# Patient Record
Sex: Female | Born: 1950
Health system: Southern US, Community
[De-identification: ages and names within clinical notes are randomized; demographics above are authoritative.]

## PROBLEM LIST (undated history)

## (undated) DIAGNOSIS — E559 Vitamin D deficiency, unspecified: Secondary | ICD-10-CM

## (undated) DIAGNOSIS — E039 Hypothyroidism, unspecified: Secondary | ICD-10-CM

## (undated) DIAGNOSIS — F419 Anxiety disorder, unspecified: Secondary | ICD-10-CM

## (undated) HISTORY — DX: Anxiety disorder, unspecified: F41.9

## (undated) HISTORY — DX: Hypothyroidism, unspecified: E03.9

## (undated) HISTORY — DX: Vitamin D deficiency, unspecified: E55.9

---

## 1998-09-19 ENCOUNTER — Other Ambulatory Visit: Admission: RE | Admit: 1998-09-19 | Discharge: 1998-09-19 | Payer: Self-pay | Admitting: *Deleted

## 2000-03-09 ENCOUNTER — Other Ambulatory Visit: Admission: RE | Admit: 2000-03-09 | Discharge: 2000-03-09 | Payer: Self-pay | Admitting: *Deleted

## 2001-03-12 ENCOUNTER — Other Ambulatory Visit: Admission: RE | Admit: 2001-03-12 | Discharge: 2001-03-12 | Payer: Self-pay | Admitting: Obstetrics and Gynecology

## 2002-06-07 ENCOUNTER — Other Ambulatory Visit: Admission: RE | Admit: 2002-06-07 | Discharge: 2002-06-07 | Payer: Self-pay | Admitting: Obstetrics and Gynecology

## 2015-02-15 DIAGNOSIS — Z1389 Encounter for screening for other disorder: Secondary | ICD-10-CM | POA: Diagnosis not present

## 2015-02-15 DIAGNOSIS — E039 Hypothyroidism, unspecified: Secondary | ICD-10-CM | POA: Diagnosis not present

## 2015-02-15 DIAGNOSIS — Z13220 Encounter for screening for lipoid disorders: Secondary | ICD-10-CM | POA: Diagnosis not present

## 2015-02-15 DIAGNOSIS — M8588 Other specified disorders of bone density and structure, other site: Secondary | ICD-10-CM | POA: Diagnosis not present

## 2015-02-15 DIAGNOSIS — Z1211 Encounter for screening for malignant neoplasm of colon: Secondary | ICD-10-CM | POA: Diagnosis not present

## 2015-02-15 DIAGNOSIS — Z0000 Encounter for general adult medical examination without abnormal findings: Secondary | ICD-10-CM | POA: Diagnosis not present

## 2015-02-15 DIAGNOSIS — E559 Vitamin D deficiency, unspecified: Secondary | ICD-10-CM | POA: Diagnosis not present

## 2015-02-15 DIAGNOSIS — R7309 Other abnormal glucose: Secondary | ICD-10-CM | POA: Diagnosis not present

## 2015-02-16 DIAGNOSIS — R7309 Other abnormal glucose: Secondary | ICD-10-CM | POA: Diagnosis not present

## 2015-02-16 DIAGNOSIS — E559 Vitamin D deficiency, unspecified: Secondary | ICD-10-CM | POA: Diagnosis not present

## 2015-02-16 DIAGNOSIS — Z1389 Encounter for screening for other disorder: Secondary | ICD-10-CM | POA: Diagnosis not present

## 2015-02-16 DIAGNOSIS — E039 Hypothyroidism, unspecified: Secondary | ICD-10-CM | POA: Diagnosis not present

## 2015-02-16 DIAGNOSIS — Z13220 Encounter for screening for lipoid disorders: Secondary | ICD-10-CM | POA: Diagnosis not present

## 2015-02-16 DIAGNOSIS — Z0000 Encounter for general adult medical examination without abnormal findings: Secondary | ICD-10-CM | POA: Diagnosis not present

## 2015-02-19 ENCOUNTER — Other Ambulatory Visit: Payer: Self-pay | Admitting: Family Medicine

## 2015-02-19 DIAGNOSIS — M8580 Other specified disorders of bone density and structure, unspecified site: Secondary | ICD-10-CM

## 2015-02-23 DIAGNOSIS — E039 Hypothyroidism, unspecified: Secondary | ICD-10-CM | POA: Diagnosis not present

## 2015-02-23 DIAGNOSIS — E559 Vitamin D deficiency, unspecified: Secondary | ICD-10-CM | POA: Diagnosis not present

## 2015-03-09 DIAGNOSIS — N951 Menopausal and female climacteric states: Secondary | ICD-10-CM | POA: Diagnosis not present

## 2015-03-22 DIAGNOSIS — M8580 Other specified disorders of bone density and structure, unspecified site: Secondary | ICD-10-CM | POA: Diagnosis not present

## 2015-03-22 DIAGNOSIS — Z1231 Encounter for screening mammogram for malignant neoplasm of breast: Secondary | ICD-10-CM | POA: Diagnosis not present

## 2015-04-18 DIAGNOSIS — K635 Polyp of colon: Secondary | ICD-10-CM | POA: Diagnosis not present

## 2015-04-18 DIAGNOSIS — Z1211 Encounter for screening for malignant neoplasm of colon: Secondary | ICD-10-CM | POA: Diagnosis not present

## 2015-04-18 DIAGNOSIS — K640 First degree hemorrhoids: Secondary | ICD-10-CM | POA: Diagnosis not present

## 2015-04-18 DIAGNOSIS — D125 Benign neoplasm of sigmoid colon: Secondary | ICD-10-CM | POA: Diagnosis not present

## 2015-05-02 DIAGNOSIS — H5213 Myopia, bilateral: Secondary | ICD-10-CM | POA: Diagnosis not present

## 2015-05-02 DIAGNOSIS — H5210 Myopia, unspecified eye: Secondary | ICD-10-CM | POA: Diagnosis not present

## 2015-05-08 DIAGNOSIS — E559 Vitamin D deficiency, unspecified: Secondary | ICD-10-CM | POA: Diagnosis not present

## 2015-06-19 DIAGNOSIS — Z0100 Encounter for examination of eyes and vision without abnormal findings: Secondary | ICD-10-CM | POA: Diagnosis not present

## 2015-06-29 DIAGNOSIS — E612 Magnesium deficiency: Secondary | ICD-10-CM | POA: Diagnosis not present

## 2015-06-29 DIAGNOSIS — E7219 Other disorders of sulfur-bearing amino-acid metabolism: Secondary | ICD-10-CM | POA: Diagnosis not present

## 2015-06-29 DIAGNOSIS — E559 Vitamin D deficiency, unspecified: Secondary | ICD-10-CM | POA: Diagnosis not present

## 2015-06-29 DIAGNOSIS — N951 Menopausal and female climacteric states: Secondary | ICD-10-CM | POA: Diagnosis not present

## 2015-06-29 DIAGNOSIS — R739 Hyperglycemia, unspecified: Secondary | ICD-10-CM | POA: Diagnosis not present

## 2015-06-29 DIAGNOSIS — E279 Disorder of adrenal gland, unspecified: Secondary | ICD-10-CM | POA: Diagnosis not present

## 2015-08-23 DIAGNOSIS — M79641 Pain in right hand: Secondary | ICD-10-CM | POA: Diagnosis not present

## 2015-08-27 DIAGNOSIS — M720 Palmar fascial fibromatosis [Dupuytren]: Secondary | ICD-10-CM | POA: Diagnosis not present

## 2015-11-23 DIAGNOSIS — M720 Palmar fascial fibromatosis [Dupuytren]: Secondary | ICD-10-CM | POA: Diagnosis not present

## 2016-02-29 DIAGNOSIS — S0501XA Injury of conjunctiva and corneal abrasion without foreign body, right eye, initial encounter: Secondary | ICD-10-CM | POA: Diagnosis not present

## 2016-04-15 DIAGNOSIS — E612 Magnesium deficiency: Secondary | ICD-10-CM | POA: Diagnosis not present

## 2016-04-15 DIAGNOSIS — E279 Disorder of adrenal gland, unspecified: Secondary | ICD-10-CM | POA: Diagnosis not present

## 2016-04-15 DIAGNOSIS — E7219 Other disorders of sulfur-bearing amino-acid metabolism: Secondary | ICD-10-CM | POA: Diagnosis not present

## 2016-04-15 DIAGNOSIS — E039 Hypothyroidism, unspecified: Secondary | ICD-10-CM | POA: Diagnosis not present

## 2016-04-15 DIAGNOSIS — E8840 Mitochondrial metabolism disorder, unspecified: Secondary | ICD-10-CM | POA: Diagnosis not present

## 2016-04-15 DIAGNOSIS — E7210 Disorders of sulfur-bearing amino-acid metabolism, unspecified: Secondary | ICD-10-CM | POA: Diagnosis not present

## 2016-04-15 DIAGNOSIS — E782 Mixed hyperlipidemia: Secondary | ICD-10-CM | POA: Diagnosis not present

## 2016-04-15 DIAGNOSIS — E611 Iron deficiency: Secondary | ICD-10-CM | POA: Diagnosis not present

## 2016-04-15 DIAGNOSIS — E559 Vitamin D deficiency, unspecified: Secondary | ICD-10-CM | POA: Diagnosis not present

## 2016-04-23 DIAGNOSIS — S62212A Bennett's fracture, left hand, initial encounter for closed fracture: Secondary | ICD-10-CM | POA: Diagnosis not present

## 2016-04-24 DIAGNOSIS — Z1231 Encounter for screening mammogram for malignant neoplasm of breast: Secondary | ICD-10-CM | POA: Diagnosis not present

## 2016-08-27 DIAGNOSIS — H04123 Dry eye syndrome of bilateral lacrimal glands: Secondary | ICD-10-CM | POA: Diagnosis not present

## 2016-09-17 DIAGNOSIS — K006 Disturbances in tooth eruption: Secondary | ICD-10-CM | POA: Diagnosis not present

## 2016-11-11 ENCOUNTER — Ambulatory Visit (INDEPENDENT_AMBULATORY_CARE_PROVIDER_SITE_OTHER): Payer: Medicare HMO | Admitting: Obstetrics & Gynecology

## 2016-11-11 ENCOUNTER — Encounter: Payer: Self-pay | Admitting: Obstetrics & Gynecology

## 2016-11-11 VITALS — BP 132/78 | Ht 67.0 in | Wt 156.0 lb

## 2016-11-11 DIAGNOSIS — Z01411 Encounter for gynecological examination (general) (routine) with abnormal findings: Secondary | ICD-10-CM | POA: Diagnosis not present

## 2016-11-11 DIAGNOSIS — Z1151 Encounter for screening for human papillomavirus (HPV): Secondary | ICD-10-CM | POA: Diagnosis not present

## 2016-11-11 DIAGNOSIS — Z113 Encounter for screening for infections with a predominantly sexual mode of transmission: Secondary | ICD-10-CM

## 2016-11-11 DIAGNOSIS — N952 Postmenopausal atrophic vaginitis: Secondary | ICD-10-CM | POA: Diagnosis not present

## 2016-11-11 LAB — URINALYSIS W MICROSCOPIC + REFLEX CULTURE
BACTERIA UA: NONE SEEN [HPF]
Bilirubin Urine: NEGATIVE
Casts: NONE SEEN [LPF]
Crystals: NONE SEEN [HPF]
GLUCOSE, UA: NEGATIVE
HGB URINE DIPSTICK: NEGATIVE
KETONES UR: NEGATIVE
LEUKOCYTES UA: NEGATIVE
Nitrite: NEGATIVE
PH: 5.5 (ref 5.0–8.0)
PROTEIN: NEGATIVE
RBC / HPF: NONE SEEN RBC/HPF (ref <=2)
Specific Gravity, Urine: <1.005 (ref 1.001–1.035)
WBC UA: NONE SEEN WBC/HPF (ref <=5)
Yeast: NONE SEEN [HPF]

## 2016-11-11 NOTE — Patient Instructions (Signed)
Your Annual/Gyn exam was normal today except for Menopausal Atrophy of the vulvar and vaginal areas.  A Pap/HPV and STI screen were done.  You will come back for Fasting Labs and a Pelvic US.  When we have all the results, we will decide on the best treatment for your issues. Michaela Elliott, it was a pleasure to see you today!

## 2016-11-11 NOTE — Addendum Note (Signed)
Addended by: Thurnell Garbe A on: 11/11/2016 10:28 AM   Modules accepted: Orders

## 2016-11-11 NOTE — Progress Notes (Signed)
Michaela Elliott [birth year] [id number]   History:    66 y.o. G3P3 New relationship x a few months.  New patient for annual gyn exam.  Menopause.  No PMB.  On Estriol-Testo cream and DHEA tabs x 03/2016, prescribed by Dr [facility].  Not on Progestins.  No Hot flashes or night sweats.  No pelvic pain.  Newly Sexually active x a few months.  Low libido and dryness/pain with IC.  Used [place].  Breasts wnl.  Very active, plays tennis.  BMI 24+.    Past medical history,surgical history, family history and social history were all reviewed and documented in the EPIC chart.  Gynecologic History No LMP recorded. Patient is postmenopausal. Contraception: post menopausal status Last Pap: About 2-3 yrs ago. Results were: normal Last mammogram: 04/2016. Results were: normal  Obstetric History OB History  Gravida Para Term Preterm AB Living  _0 SAB TAB Ectopic Multiple Live Births               # Outcome Date GA Lbr Len/2nd Weight Sex Delivery Anes PTL Lv  3 Para           2 Para           1 Para                ROS: A ROS was performed and pertinent positives and negatives are included in the history.  GENERAL: No fevers or chills. HEENT: No change in vision, no earache, sore throat or sinus congestion. NECK: No pain or stiffness. CARDIOVASCULAR: No chest pain or pressure. No palpitations. PULMONARY: No shortness of breath, cough or wheeze. GASTROINTESTINAL: No abdominal pain, nausea, vomiting or diarrhea, melena or bright red blood per rectum. GENITOURINARY: No urinary frequency, urgency, hesitancy or dysuria. MUSCULOSKELETAL: No joint or muscle pain, no back pain, no recent trauma. DERMATOLOGIC: No rash, no itching, no lesions. ENDOCRINE: No polyuria, polydipsia, no heat or cold intolerance. No recent change in weight. HEMATOLOGICAL: No anemia or easy bruising or bleeding. NEUROLOGIC: No headache, seizures, numbness, tingling or weakness. PSYCHIATRIC: No depression, no loss of  interest in normal activity or change in sleep pattern.     Exam:   BP 132/78   Ht _1  (1.702 m)   Wt 156 lb (70.8 kg)   BMI 24.43 kg/m   Body mass index is 24.43 kg/m.  General appearance : Well developed well nourished female. No acute distress HEENT: Eyes: no retinal hemorrhage or exudates,  Neck supple, trachea midline, no carotid bruits, no thyroidmegaly Lungs: Clear to auscultation, no rhonchi or wheezes, or rib retractions  Heart: Regular rate and rhythm, no murmurs or gallops Breast:Examined in sitting and supine position were symmetrical in appearance, no palpable masses or tenderness,  no skin retraction, no nipple inversion, no nipple discharge, no skin discoloration, no axillary or supraclavicular lymphadenopathy Abdomen: no palpable masses or tenderness, no rebound or guarding Extremities: no edema or skin discoloration or tenderness  Pelvic:  Bartholin, Urethra, Skene Glands: Within normal limits             Vagina: No gross lesions or discharge  Cervix: No gross lesions or discharge.  Pap/HR HPV/STI screen done.  Uterus  AV, normal size, shape and consistency, non-tender and mobile  Adnexa  Without masses or tenderness  Anus and perineum  normal      Assessment/Plan:  66 y.o. female for annual exam  1. Encounter for gynecological  examination with abnormal finding Normal Annual/Gyn exam except for Atrophic Vaginitis.  Pap/HPV HR pending, with Gono-Chlam on Pap - Comp Met (CMET) - Vitamin D 1,25 dihydroxy - Lipid panel - TSH - T4, free - T3, free - DHEA - DHEA-sulfate  2. Post-menopausal atrophic vaginitis Starting back with sexual activity.  Will consider Estrace cream at f/u.  Using [place]. Currently using Estriol/Testo cream a putting a little in vulvar/vaginal area.  Unopposed Estrogens, will verify Endometrial lining by [place]. - US Transvaginal Non-OB; Future  3. Screen for STD (sexually transmitted disease) Unprotected IC. - HIV antibody - RPR -  Hepatitis B Surface AntiGEN - Hepatitis C Antibody - CBC  Counseling on above issues >50% x 20 minutes.  Princess Bruins MD, 9:27 AM 11/11/2016

## 2016-11-12 ENCOUNTER — Encounter: Payer: Self-pay | Admitting: Obstetrics & Gynecology

## 2016-11-12 ENCOUNTER — Other Ambulatory Visit: Payer: Medicare HMO

## 2016-11-12 DIAGNOSIS — Z01411 Encounter for gynecological examination (general) (routine) with abnormal findings: Secondary | ICD-10-CM | POA: Diagnosis not present

## 2016-11-12 DIAGNOSIS — Z113 Encounter for screening for infections with a predominantly sexual mode of transmission: Secondary | ICD-10-CM | POA: Diagnosis not present

## 2016-11-12 LAB — CBC
HCT: 39.6 % (ref 35.0–45.0)
HEMOGLOBIN: 12.9 g/dL (ref 11.7–15.5)
MCH: 27.8 pg (ref 27.0–33.0)
MCHC: 32.6 g/dL (ref 32.0–36.0)
MCV: 85.3 fL (ref 80.0–100.0)
MPV: 10.3 fL (ref 7.5–12.5)
Platelets: 237 10*3/uL (ref 140–400)
RBC: 4.64 MIL/uL (ref 3.80–5.10)
RDW: 13.7 % (ref 11.0–15.0)
WBC: 4.8 10*3/uL (ref 3.8–10.8)

## 2016-11-12 LAB — COMPREHENSIVE METABOLIC PANEL
ALT: 23 U/L (ref 6–29)
AST: 19 U/L (ref 10–35)
Albumin: 4.1 g/dL (ref 3.6–5.1)
Alkaline Phosphatase: 72 U/L (ref 33–130)
BILIRUBIN TOTAL: 0.7 mg/dL (ref 0.2–1.2)
BUN: 19 mg/dL (ref 7–25)
CO2: 23 mmol/L (ref 20–31)
Calcium: 9.1 mg/dL (ref 8.6–10.4)
Chloride: 106 mmol/L (ref 98–110)
Creat: 0.92 mg/dL (ref 0.50–0.99)
Glucose, Bld: 103 mg/dL — ABNORMAL HIGH (ref 65–99)
Potassium: 4.6 mmol/L (ref 3.5–5.3)
Sodium: 140 mmol/L (ref 135–146)
Total Protein: 6.3 g/dL (ref 6.1–8.1)

## 2016-11-12 LAB — T3, FREE: T3, Free: 3.1 pg/mL (ref 2.3–4.2)

## 2016-11-12 LAB — LIPID PANEL
Cholesterol: 175 mg/dL (ref <200)
HDL: 62 mg/dL (ref >50)
LDL Cholesterol: 96 mg/dL (ref <100)
Total CHOL/HDL Ratio: 2.8 Ratio (ref <5.0)
Triglycerides: 86 mg/dL (ref <150)
VLDL: 17 mg/dL (ref <30)

## 2016-11-12 LAB — T4, FREE: FREE T4: 1 ng/dL (ref 0.8–1.8)

## 2016-11-12 LAB — TSH: TSH: 0.02 m[IU]/L — AB

## 2016-11-13 ENCOUNTER — Encounter: Payer: Self-pay | Admitting: Obstetrics & Gynecology

## 2016-11-13 LAB — RPR

## 2016-11-13 LAB — PAP IG, CT-NG NAA, HPV HIGH-RISK
CHLAMYDIA PROBE AMP: NOT DETECTED
GC PROBE AMP: NOT DETECTED
HPV DNA HIGH RISK: NOT DETECTED

## 2016-11-13 LAB — HEPATITIS B SURFACE ANTIGEN: Hepatitis B Surface Ag: NEGATIVE

## 2016-11-13 LAB — HIV ANTIBODY (ROUTINE TESTING W REFLEX): HIV: NONREACTIVE

## 2016-11-13 LAB — HEPATITIS C ANTIBODY: HCV Ab: NEGATIVE

## 2016-11-13 LAB — DHEA-SULFATE: DHEA SO4: 153 ug/dL — AB (ref 12–133)

## 2016-11-15 LAB — DHEA: DHEA: 227 ng/dL (ref 102–1185)

## 2016-11-16 LAB — VITAMIN D 1,25 DIHYDROXY
VITAMIN D3 1, 25 (OH): 59 pg/mL
Vitamin D 1, 25 (OH)2 Total: 59 pg/mL (ref 18–72)

## 2016-11-17 ENCOUNTER — Ambulatory Visit: Payer: Medicare HMO | Admitting: Obstetrics & Gynecology

## 2016-11-17 ENCOUNTER — Other Ambulatory Visit: Payer: Medicare HMO

## 2016-11-20 ENCOUNTER — Telehealth: Payer: Self-pay

## 2016-11-20 ENCOUNTER — Other Ambulatory Visit: Payer: Self-pay | Admitting: Obstetrics & Gynecology

## 2016-11-20 DIAGNOSIS — R7989 Other specified abnormal findings of blood chemistry: Secondary | ICD-10-CM

## 2016-11-20 NOTE — Telephone Encounter (Signed)
After 1 month off Nature Thyroid, we will repeat her Thyroid labs with TSH, FT3, FT4.  If those results are normal, it will mean that her thyroid function is completely normal, therefore no need to treat, even dangerous to treat because it can cause Osteoporosis.  We can set an appointment after the labs are rechecked in 1 month to discuss how we will manage Menopause and her symptoms.  She doesn't need to see an Endocrinologist at this time because she probably doesn't have Thyroid dysfunction, the abnormal result was caused by the medication.

## 2016-11-20 NOTE — Telephone Encounter (Signed)
Already had encounter opened.

## 2016-11-20 NOTE — Telephone Encounter (Signed)
Did you see my note that I could not add Hgb A1c because blood discarded. Did you want her have that checked as well when she returns in one mos for thyroid tests?

## 2016-11-20 NOTE — Telephone Encounter (Signed)
I called patient and left detailed message per DPR access note on file. I read her what Dr. Dellis Filbert had recommended in her note. I will place lab order and she will call for lab appt for one month to recheck.

## 2016-11-20 NOTE — Telephone Encounter (Addendum)
Patient was informed of results. She asked about discontinuing the Nature Thyroid. She asked questions:  1.  What am I going to do about my thyroid then? 2. How long will I need to stay off of it? 3. What is problem with it being this low? 4. Should I see an endocrinologist and get 2nd opinion.  She said she saw a Dr. Deirdre Pippins who started her on this. She said this doctor told her that if she had her level checked by another doctor that they would tell her it was at a "toxic level" but that this is where it needs to be for her to feel well.  Also, Lab could not add HgbA1c as blood already discarded.

## 2016-11-20 NOTE — Telephone Encounter (Signed)
-----   Message from Princess Bruins, MD sent at 11/17/2016  9:07 AM EDT ----- DHEAS increased, recommend to stop her supplement of DHEA.  TSH too low, recommend to stop Nature-Thyroid.  Glucose 103, add HBA1C.  Low sugar diet recommended.  All other labs normal.  Offer a visit with me to discuss results and management of Menopause.

## 2016-11-20 NOTE — Telephone Encounter (Signed)
-----   Message from Michaela Bruins, MD sent at 11/17/2016  9:07 AM EDT ----- DHEAS increased, recommend to stop her supplement of DHEA.  TSH too low, recommend to stop Nature-Thyroid.  Glucose 103, add HBA1C.  Low sugar diet recommended.  All other labs normal.  Offer a visit with me to discuss results and management of Menopause.

## 2016-11-21 ENCOUNTER — Other Ambulatory Visit: Payer: Self-pay | Admitting: Obstetrics & Gynecology

## 2016-11-21 DIAGNOSIS — R7309 Other abnormal glucose: Secondary | ICD-10-CM

## 2016-11-21 NOTE — Telephone Encounter (Signed)
Yes please, add HBA1C with TSH, FT3, FT4 when she returns in 1 month.  Thanks

## 2016-11-21 NOTE — Telephone Encounter (Signed)
Order added.

## 2016-12-01 ENCOUNTER — Encounter: Payer: Self-pay | Admitting: Obstetrics & Gynecology

## 2016-12-04 DIAGNOSIS — H04123 Dry eye syndrome of bilateral lacrimal glands: Secondary | ICD-10-CM | POA: Diagnosis not present

## 2016-12-19 ENCOUNTER — Other Ambulatory Visit: Payer: Medicare HMO

## 2016-12-19 DIAGNOSIS — R7309 Other abnormal glucose: Secondary | ICD-10-CM

## 2016-12-19 DIAGNOSIS — R946 Abnormal results of thyroid function studies: Secondary | ICD-10-CM | POA: Diagnosis not present

## 2016-12-19 DIAGNOSIS — R7989 Other specified abnormal findings of blood chemistry: Secondary | ICD-10-CM

## 2016-12-19 LAB — T4, FREE: FREE T4: 0.2 ng/dL — AB (ref 0.8–1.8)

## 2016-12-19 LAB — TSH: TSH: 36.21 mIU/L — ABNORMAL HIGH

## 2016-12-19 LAB — T3, FREE: T3 FREE: 0.8 pg/mL — AB (ref 2.3–4.2)

## 2016-12-20 LAB — HEMOGLOBIN A1C
HEMOGLOBIN A1C: 5.6 % (ref <5.7)
Mean Plasma Glucose: 114 mg/dL

## 2016-12-25 ENCOUNTER — Other Ambulatory Visit: Payer: Self-pay | Admitting: Obstetrics & Gynecology

## 2016-12-25 MED ORDER — THYROID 162.5 MG PO TABS: 162.5000 mg | ORAL_TABLET | Freq: Every day | ORAL | 3 refills | Status: DC

## 2016-12-30 ENCOUNTER — Telehealth: Payer: Self-pay | Admitting: *Deleted

## 2016-12-30 DIAGNOSIS — E038 Other specified hypothyroidism: Secondary | ICD-10-CM

## 2016-12-30 NOTE — Telephone Encounter (Signed)
Referral placed at [facility], they will contact pt to schedule.

## 2016-12-30 NOTE — Telephone Encounter (Signed)
-----   Message from Ramond Craver, [place] sent at 12/25/2016  3:16 PM EDT ----- Regarding: referral to endocrinology Princess Bruins, MD  [facility] Provider Pool    Hypothyroidism. She will start back on Nature Thyroid and please refer to [employer].

## 2017-01-09 NOTE — Telephone Encounter (Signed)
Endocrinology office called pt and pt said she was going to call endo back to schedule.

## 2017-03-18 DIAGNOSIS — K006 Disturbances in tooth eruption: Secondary | ICD-10-CM | POA: Diagnosis not present

## 2017-04-17 DIAGNOSIS — E039 Hypothyroidism, unspecified: Secondary | ICD-10-CM | POA: Diagnosis not present

## 2017-04-18 DIAGNOSIS — H1013 Acute atopic conjunctivitis, bilateral: Secondary | ICD-10-CM | POA: Diagnosis not present

## 2017-04-18 DIAGNOSIS — J069 Acute upper respiratory infection, unspecified: Secondary | ICD-10-CM | POA: Diagnosis not present

## 2017-04-20 DIAGNOSIS — H10503 Unspecified blepharoconjunctivitis, bilateral: Secondary | ICD-10-CM | POA: Diagnosis not present

## 2017-05-08 DIAGNOSIS — Z0100 Encounter for examination of eyes and vision without abnormal findings: Secondary | ICD-10-CM | POA: Diagnosis not present

## 2017-05-08 DIAGNOSIS — H5213 Myopia, bilateral: Secondary | ICD-10-CM | POA: Diagnosis not present

## 2017-06-18 DIAGNOSIS — Z1231 Encounter for screening mammogram for malignant neoplasm of breast: Secondary | ICD-10-CM | POA: Diagnosis not present

## 2017-07-03 DIAGNOSIS — E039 Hypothyroidism, unspecified: Secondary | ICD-10-CM | POA: Diagnosis not present

## 2017-07-28 DIAGNOSIS — E039 Hypothyroidism, unspecified: Secondary | ICD-10-CM | POA: Diagnosis not present

## 2017-10-14 DIAGNOSIS — E039 Hypothyroidism, unspecified: Secondary | ICD-10-CM | POA: Diagnosis not present

## 2017-11-26 DIAGNOSIS — E039 Hypothyroidism, unspecified: Secondary | ICD-10-CM | POA: Diagnosis not present

## 2017-11-27 ENCOUNTER — Telehealth: Payer: Self-pay | Admitting: *Deleted

## 2017-11-27 MED ORDER — NONFORMULARY OR COMPOUNDED ITEM: 1 refills | Status: DC

## 2017-11-27 MED ORDER — PROGESTERONE MICRONIZED 100 MG PO CAPS: 100.0000 mg | ORAL_CAPSULE | Freq: Every day | ORAL | 1 refills | Status: DC

## 2017-11-27 NOTE — Telephone Encounter (Signed)
Rx called in for cream, Rx sent for Prometrium 100 mg capsules.

## 2017-11-27 NOTE — Telephone Encounter (Signed)
-----   Message from Princess Bruins, MD sent at 11/27/2017  8:16 AM EDT ----- Regarding: Conclusion on management Spoke with patient last evening.  Patient has not been taking her Estriol-Testo cream for a year.  Therefore, no need to do a Pelvic US and no need for a Provera withdrawal.  She can just keep her Annual/Gyn visit as is.  But after counseling, she wants to restart on Estriol 0.3 mg-Testo 1 mg cream per gram, 1 click daily (recommend 1 click daily rather than 2 because she is now 67 yo).  Please also send a prescription of Prometrium 100 mg caps at bedtime daily.  Can prescribe x 3 months with 1 refill. Thanks, Dr Dellis Filbert

## 2017-12-09 DIAGNOSIS — F419 Anxiety disorder, unspecified: Secondary | ICD-10-CM | POA: Diagnosis not present

## 2017-12-09 DIAGNOSIS — E559 Vitamin D deficiency, unspecified: Secondary | ICD-10-CM | POA: Diagnosis not present

## 2017-12-09 DIAGNOSIS — R7303 Prediabetes: Secondary | ICD-10-CM | POA: Diagnosis not present

## 2017-12-09 DIAGNOSIS — E039 Hypothyroidism, unspecified: Secondary | ICD-10-CM | POA: Diagnosis not present

## 2017-12-22 ENCOUNTER — Ambulatory Visit: Payer: Medicare HMO | Admitting: Obstetrics & Gynecology

## 2017-12-22 ENCOUNTER — Encounter: Payer: Self-pay | Admitting: Obstetrics & Gynecology

## 2017-12-22 VITALS — BP 120/78 | Ht 67.0 in | Wt 157.0 lb

## 2017-12-22 DIAGNOSIS — Z01419 Encounter for gynecological examination (general) (routine) without abnormal findings: Secondary | ICD-10-CM | POA: Diagnosis not present

## 2017-12-22 DIAGNOSIS — E039 Hypothyroidism, unspecified: Secondary | ICD-10-CM

## 2017-12-22 DIAGNOSIS — N952 Postmenopausal atrophic vaginitis: Secondary | ICD-10-CM

## 2017-12-22 DIAGNOSIS — Z780 Asymptomatic menopausal state: Secondary | ICD-10-CM

## 2017-12-22 MED ORDER — ESTRADIOL 10 MCG VA TABS: 1.0000 | ORAL_TABLET | VAGINAL | 5 refills | Status: DC

## 2017-12-22 NOTE — Progress Notes (Addendum)
Michaela Elliott [birth year] [id number]   History:    67 y.o. G3P3L3 Stable >1 yr boyfriend (lives in [place])  RP:  Established patient presenting for annual gyn exam   HPI: Menopause, well on no HRT x >1 year.  No PMB.  No pelvic pain.  Still having dryness and pain with IC.  Using [place].  No vaginal discharge.  Urine/BMs wnl.  Very fit, BMI 24.59.  Health labs with Fam MD.  Will see an Endocrinologist to manage her Hypothyroidism.  Past medical history,surgical history, family history and social history were all reviewed and documented in the EPIC chart.  Gynecologic History No LMP recorded. Patient is postmenopausal. Contraception: post menopausal status Last Pap: 10/2016. Results were: Negative, HPV HR neg Last mammogram: 05/2017. Results were: Negative Bone Density: 03/2015.  Osteopenia T-score -2.4.  Referral to repeat BD given to patient Colonoscopy: 5 yrs ago  Obstetric History OB History  Gravida Para Term Preterm AB Living  3 3     0 3  SAB TAB Ectopic Multiple Live Births      0        # Outcome Date GA Lbr Len/2nd Weight Sex Delivery Anes PTL Lv  3 Para           2 Para           1 Para              ROS: A ROS was performed and pertinent positives and negatives are included in the history.  GENERAL: No fevers or chills. HEENT: No change in vision, no earache, sore throat or sinus congestion. NECK: No pain or stiffness. CARDIOVASCULAR: No chest pain or pressure. No palpitations. PULMONARY: No shortness of breath, cough or wheeze. GASTROINTESTINAL: No abdominal pain, nausea, vomiting or diarrhea, melena or bright red blood per rectum. GENITOURINARY: No urinary frequency, urgency, hesitancy or dysuria. MUSCULOSKELETAL: No joint or muscle pain, no back pain, no recent trauma. DERMATOLOGIC: No rash, no itching, no lesions. ENDOCRINE: No polyuria, polydipsia, no heat or cold intolerance. No recent change in weight. HEMATOLOGICAL: No anemia or easy bruising or bleeding. NEUROLOGIC: No  headache, seizures, numbness, tingling or weakness. PSYCHIATRIC: No depression, no loss of interest in normal activity or change in sleep pattern.     Exam:   BP 120/78 (BP Location: Right Arm, Patient Position: Sitting, Cuff Size: Normal)   Ht 5\' 7"  (1.702 m)   Wt 157 lb (71.2 kg)   BMI 24.59 kg/m   Body mass index is 24.59 kg/m.  General appearance : Well developed well nourished female. No acute distress HEENT: Eyes: no retinal hemorrhage or exudates,  Neck supple, trachea midline, no carotid bruits, no thyroidmegaly Lungs: Clear to auscultation, no rhonchi or wheezes, or rib retractions  Heart: Regular rate and rhythm, no murmurs or gallops Breast:Examined in sitting and supine position were symmetrical in appearance, no palpable masses or tenderness,  no skin retraction, no nipple inversion, no nipple discharge, no skin discoloration, no axillary or supraclavicular lymphadenopathy Abdomen: no palpable masses or tenderness, no rebound or guarding Extremities: no edema or skin discoloration or tenderness  Pelvic: Vulva: Normal             Vagina: No gross lesions or discharge  Cervix: No gross lesions or discharge  Uterus  AV, normal size, shape and consistency, non-tender and mobile  Adnexa  Without masses or tenderness  Anus: Normal   Assessment/Plan:  67 y.o. female for annual exam   1.  Well female exam with routine gynecological exam Normal gynecologic exam and menopause.  Pap test negative with negative high-risk HPV April 2018.  Breast exam normal.  Screening mammogram negative at [facility] 05/2017.  Health labs with family physician.  Will see an endocrinologist for hypothyroidism management.  Body mass index 24.59.  Continue with fitness and good nutrition.  2. Menopause present Well on no hormone replacement therapy.  No postmenopausal bleeding.  Last BD 03/2015 Osteopenia T-Score -2.4.  Will repeat bone density at [facility] now.  Continue with vitamin D supplements, calcium  rich nutrition and regular weightbearing physical activity.  3. Post-menopausal atrophic vaginitis Decision to start on Vagifem to improve the vaginal mucosa and vulvar skin.  Usage reviewed.  Prescription sent to pharmacy.  4. Acquired hypothyroidism Will see endocrinologist.  Other orders - Estradiol 10 MCG TABS vaginal tablet; Place 1 tablet (10 mcg total) vaginally 2 (two) times a week. 1 tab vaginally daily x 2 weeks, then twice a week.  Princess Bruins MD, 8:29 AM 12/22/2017

## 2017-12-22 NOTE — Patient Instructions (Signed)
1. Well female exam with routine gynecological exam Normal gynecologic exam and menopause.  Pap test negative with negative high-risk HPV April 2018.  Breast exam normal.  Will obtain screening mammogram done in 2019 from [place].  Health labs with family physician.  Will see an endocrinologist for hypothyroidism management.  Body mass index 24.59.  Continue with fitness and good nutrition.  2. Menopause present Well on no hormone replacement therapy.  No postmenopausal bleeding.  Will repeat bone density at [facility].  Continue with vitamin D supplements, calcium rich nutrition and regular weightbearing physical activity.  3. Post-menopausal atrophic vaginitis Decision to start on Vagifem to improve the vaginal mucosa and vulvar skin.  Usage reviewed.  Prescription sent to pharmacy.  4. Acquired hypothyroidism Will see endocrinologist.  Other orders - Estradiol 10 MCG TABS vaginal tablet; Place 1 tablet (10 mcg total) vaginally 2 (two) times a week. 1 tab vaginally daily x 2 weeks, then twice a week.  Michaela Elliott, good seeing you today!

## 2017-12-23 NOTE — Addendum Note (Signed)
Addended by: Princess Bruins on: 12/23/2017 12:18 PM   Modules accepted: Orders

## 2017-12-24 ENCOUNTER — Telehealth: Payer: Self-pay

## 2017-12-24 NOTE — Telephone Encounter (Signed)
Yes, agree with compound Estradiol cream twice a week.

## 2017-12-24 NOTE — Telephone Encounter (Signed)
Re: Estradiol 10 mcg vaginal tabs.  Pharmacy sent note "Patient's copayment with insurance was very expensive. She would like to consider a compound estradiol cream. (We make estradiol cream 0.01% and 0.02% cream.) Please let us know if that would be appropriate. Thanks"

## 2017-12-25 ENCOUNTER — Other Ambulatory Visit: Payer: Self-pay

## 2017-12-25 MED ORDER — NONFORMULARY OR COMPOUNDED ITEM: 11 refills | Status: DC

## 2017-12-25 NOTE — Telephone Encounter (Signed)
Rx called in 

## 2017-12-28 MED ORDER — NONFORMULARY OR COMPOUNDED ITEM: 11 refills | Status: DC

## 2017-12-28 NOTE — Telephone Encounter (Signed)
Michaela Elliott called from [place] asking should Rx from 12/25/17 be estradiol 0.02% cream,. I called and left a detailed message on cell explaining yes it should. Rx called in.

## 2017-12-28 NOTE — Addendum Note (Signed)
Addended by: Thamas Jaegers on: 12/28/2017 02:13 PM   Modules accepted: Orders

## 2017-12-30 DIAGNOSIS — E039 Hypothyroidism, unspecified: Secondary | ICD-10-CM | POA: Diagnosis not present

## 2018-01-01 DIAGNOSIS — M8589 Other specified disorders of bone density and structure, multiple sites: Secondary | ICD-10-CM | POA: Diagnosis not present

## 2018-01-06 ENCOUNTER — Encounter: Payer: Self-pay | Admitting: Anesthesiology

## 2018-01-08 ENCOUNTER — Encounter: Payer: Self-pay | Admitting: Anesthesiology

## 2018-01-18 ENCOUNTER — Encounter: Payer: Self-pay | Admitting: Anesthesiology

## 2018-02-04 ENCOUNTER — Encounter: Payer: Medicare HMO | Admitting: Obstetrics & Gynecology

## 2018-02-22 ENCOUNTER — Encounter: Payer: Medicare HMO | Admitting: Obstetrics & Gynecology

## 2018-03-03 DIAGNOSIS — E039 Hypothyroidism, unspecified: Secondary | ICD-10-CM | POA: Diagnosis not present

## 2018-03-30 DIAGNOSIS — J209 Acute bronchitis, unspecified: Secondary | ICD-10-CM | POA: Diagnosis not present

## 2018-04-09 DIAGNOSIS — E039 Hypothyroidism, unspecified: Secondary | ICD-10-CM | POA: Diagnosis not present

## 2018-04-13 DIAGNOSIS — E039 Hypothyroidism, unspecified: Secondary | ICD-10-CM | POA: Diagnosis not present

## 2018-05-24 DIAGNOSIS — E039 Hypothyroidism, unspecified: Secondary | ICD-10-CM | POA: Diagnosis not present

## 2018-07-02 DIAGNOSIS — R05 Cough: Secondary | ICD-10-CM | POA: Diagnosis not present

## 2018-07-23 DIAGNOSIS — E039 Hypothyroidism, unspecified: Secondary | ICD-10-CM | POA: Diagnosis not present

## 2018-10-01 DIAGNOSIS — E039 Hypothyroidism, unspecified: Secondary | ICD-10-CM | POA: Diagnosis not present

## 2018-10-05 DIAGNOSIS — Z0100 Encounter for examination of eyes and vision without abnormal findings: Secondary | ICD-10-CM | POA: Diagnosis not present

## 2018-11-15 DIAGNOSIS — E039 Hypothyroidism, unspecified: Secondary | ICD-10-CM | POA: Diagnosis not present

## 2018-11-16 DIAGNOSIS — E039 Hypothyroidism, unspecified: Secondary | ICD-10-CM | POA: Diagnosis not present

## 2018-11-16 DIAGNOSIS — R7301 Impaired fasting glucose: Secondary | ICD-10-CM | POA: Diagnosis not present

## 2018-12-28 ENCOUNTER — Encounter: Payer: Medicare HMO | Admitting: Obstetrics & Gynecology

## 2019-02-08 DIAGNOSIS — R7301 Impaired fasting glucose: Secondary | ICD-10-CM | POA: Diagnosis not present

## 2019-02-08 DIAGNOSIS — E039 Hypothyroidism, unspecified: Secondary | ICD-10-CM | POA: Diagnosis not present

## 2019-02-09 ENCOUNTER — Other Ambulatory Visit: Payer: Self-pay

## 2019-02-10 ENCOUNTER — Ambulatory Visit (INDEPENDENT_AMBULATORY_CARE_PROVIDER_SITE_OTHER): Payer: Medicare HMO | Admitting: Obstetrics & Gynecology

## 2019-02-10 ENCOUNTER — Encounter: Payer: Self-pay | Admitting: Obstetrics & Gynecology

## 2019-02-10 VITALS — BP 110/68 | Ht 66.0 in | Wt 150.0 lb

## 2019-02-10 DIAGNOSIS — Z780 Asymptomatic menopausal state: Secondary | ICD-10-CM

## 2019-02-10 DIAGNOSIS — Z01419 Encounter for gynecological examination (general) (routine) without abnormal findings: Secondary | ICD-10-CM

## 2019-02-10 DIAGNOSIS — M8589 Other specified disorders of bone density and structure, multiple sites: Secondary | ICD-10-CM

## 2019-02-10 NOTE — Progress Notes (Signed)
Michaela Elliott [birth year] [id number]   History:    68 y.o. G3P3L3  Boyfriend x 2 yrs.  Retired.  RP:  Established patient presenting for annual gyn exam   HPI: Menopause, well on no HRT.  No PMB.  No pelvic pain.  Rarely sexually active.  Urine/BMs normal.  Breasts normal.  BMI 24.21.  Very fit.  Healthy nutrition.  Hypothyroidism well controled now, followed by Dr Chalmers Cater.  Health labs with Fam MD.  Past medical history,surgical history, family history and social history were all reviewed and documented in the EPIC chart.  Gynecologic History No LMP recorded. Patient is postmenopausal. Contraception: post menopausal status Last Pap: 10/2016. Results were: Negative Last mammogram: 05/2017. Results were: Negative Bone Density: 01/2018 Osteopenia Rt Femur neck -1.9 Colonoscopy: 6 yrs ago  Obstetric History OB History  Gravida Para Term Preterm AB Living  3 3     0 3  SAB TAB Ectopic Multiple Live Births      0        # Outcome Date GA Lbr Len/2nd Weight Sex Delivery Anes PTL Lv  3 Para           2 Para           1 Para              ROS: A ROS was performed and pertinent positives and negatives are included in the history.  GENERAL: No fevers or chills. HEENT: No change in vision, no earache, sore throat or sinus congestion. NECK: No pain or stiffness. CARDIOVASCULAR: No chest pain or pressure. No palpitations. PULMONARY: No shortness of breath, cough or wheeze. GASTROINTESTINAL: No abdominal pain, nausea, vomiting or diarrhea, melena or bright red blood per rectum. GENITOURINARY: No urinary frequency, urgency, hesitancy or dysuria. MUSCULOSKELETAL: No joint or muscle pain, no back pain, no recent trauma. DERMATOLOGIC: No rash, no itching, no lesions. ENDOCRINE: No polyuria, polydipsia, no heat or cold intolerance. No recent change in weight. HEMATOLOGICAL: No anemia or easy bruising or bleeding. NEUROLOGIC: No headache, seizures, numbness, tingling or weakness. PSYCHIATRIC: No depression,  no loss of interest in normal activity or change in sleep pattern.     Exam:   BP 110/68   Ht 5\' 6"  (1.676 m)   Wt 150 lb (68 kg)   BMI 24.21 kg/m   Body mass index is 24.21 kg/m.  General appearance : Well developed well nourished female. No acute distress HEENT: Eyes: no retinal hemorrhage or exudates,  Neck supple, trachea midline, no carotid bruits, no thyroidmegaly Lungs: Clear to auscultation, no rhonchi or wheezes, or rib retractions  Heart: Regular rate and rhythm, no murmurs or gallops Breast:Examined in sitting and supine position were symmetrical in appearance, no palpable masses or tenderness,  no skin retraction, no nipple inversion, no nipple discharge, no skin discoloration, no axillary or supraclavicular lymphadenopathy Abdomen: no palpable masses or tenderness, no rebound or guarding Extremities: no edema or skin discoloration or tenderness  Pelvic: Vulva: Normal             Vagina: No gross lesions or discharge  Cervix: No gross lesions or discharge.  Pap reflex done.  Uterus  AV, normal size, shape and consistency, non-tender and mobile  Adnexa  Without masses or tenderness  Anus: Normal   Assessment/Plan:  68 y.o. female for annual exam   1. Encounter for routine gynecological examination with Papanicolaou smear of cervix Normal gynecologic exam in menopause.  Pap reflex done.  Breast exam normal.  Last screening mammogram November 2018 was negative, will schedule a screening mammogram now.  Colonoscopy 6 years ago.  Health labs with family physician.  Good body mass index at 24.21.  Continue with fitness and healthy nutrition.  2. Postmenopause Well on no hormone replacement therapy.  No postmenopausal bleeding.  3. Osteopenia of multiple sites Osteopenia on bone density July 2019 with a T score of -1.9 at the right fibular neck.  Continue with vitamin D supplements, calcium intake of 1200 mg daily and regular weightbearing physical activities.  We will  repeat a bone density in July 2021.  Other orders - thyroid (ARMOUR) 90 MG tablet; Take 90 mg by mouth daily.  Princess Bruins MD, 10:29 AM 02/10/2019

## 2019-02-11 DIAGNOSIS — Z01419 Encounter for gynecological examination (general) (routine) without abnormal findings: Secondary | ICD-10-CM | POA: Diagnosis not present

## 2019-02-14 LAB — PAP IG W/ RFLX HPV ASCU

## 2019-02-15 DIAGNOSIS — E039 Hypothyroidism, unspecified: Secondary | ICD-10-CM | POA: Diagnosis not present

## 2019-02-15 DIAGNOSIS — R7301 Impaired fasting glucose: Secondary | ICD-10-CM | POA: Diagnosis not present

## 2019-02-15 DIAGNOSIS — R42 Dizziness and giddiness: Secondary | ICD-10-CM | POA: Diagnosis not present

## 2019-02-20 ENCOUNTER — Encounter: Payer: Self-pay | Admitting: Obstetrics & Gynecology

## 2019-02-20 NOTE — Patient Instructions (Signed)
1. Encounter for routine gynecological examination with Papanicolaou smear of cervix Normal gynecologic exam in menopause.  Pap reflex done.  Breast exam normal.  Last screening mammogram November 2018 was negative, will schedule a screening mammogram now.  Colonoscopy 6 years ago.  Health labs with family physician.  Good body mass index at 24.21.  Continue with fitness and healthy nutrition.  2. Postmenopause Well on no hormone replacement therapy.  No postmenopausal bleeding.  3. Osteopenia of multiple sites Osteopenia on bone density July 2019 with a T score of -1.9 at the right fibular neck.  Continue with vitamin D supplements, calcium intake of 1200 mg daily and regular weightbearing physical activities.  We will repeat a bone density in July 2021.  Other orders - thyroid (ARMOUR) 90 MG tablet; Take 90 mg by mouth daily.  Michaela Elliott, it was a pleasure seeing you today!  I will inform you of your results as soon as they are available.

## 2019-03-02 ENCOUNTER — Encounter: Payer: Self-pay | Admitting: Obstetrics & Gynecology

## 2019-03-02 DIAGNOSIS — Z1231 Encounter for screening mammogram for malignant neoplasm of breast: Secondary | ICD-10-CM | POA: Diagnosis not present

## 2019-04-12 DIAGNOSIS — E559 Vitamin D deficiency, unspecified: Secondary | ICD-10-CM | POA: Diagnosis not present

## 2019-04-12 DIAGNOSIS — E039 Hypothyroidism, unspecified: Secondary | ICD-10-CM | POA: Diagnosis not present

## 2019-04-12 DIAGNOSIS — R7303 Prediabetes: Secondary | ICD-10-CM | POA: Diagnosis not present

## 2019-04-12 DIAGNOSIS — G479 Sleep disorder, unspecified: Secondary | ICD-10-CM | POA: Diagnosis not present

## 2019-04-12 DIAGNOSIS — M8588 Other specified disorders of bone density and structure, other site: Secondary | ICD-10-CM | POA: Diagnosis not present

## 2019-06-02 DIAGNOSIS — Z20828 Contact with and (suspected) exposure to other viral communicable diseases: Secondary | ICD-10-CM | POA: Diagnosis not present

## 2019-06-14 DIAGNOSIS — S76312A Strain of muscle, fascia and tendon of the posterior muscle group at thigh level, left thigh, initial encounter: Secondary | ICD-10-CM | POA: Diagnosis not present

## 2019-06-20 DIAGNOSIS — M6281 Muscle weakness (generalized): Secondary | ICD-10-CM | POA: Diagnosis not present

## 2019-06-20 DIAGNOSIS — S76312D Strain of muscle, fascia and tendon of the posterior muscle group at thigh level, left thigh, subsequent encounter: Secondary | ICD-10-CM | POA: Diagnosis not present

## 2019-06-22 DIAGNOSIS — S76312D Strain of muscle, fascia and tendon of the posterior muscle group at thigh level, left thigh, subsequent encounter: Secondary | ICD-10-CM | POA: Diagnosis not present

## 2019-06-22 DIAGNOSIS — M6281 Muscle weakness (generalized): Secondary | ICD-10-CM | POA: Diagnosis not present

## 2019-06-28 DIAGNOSIS — S76312D Strain of muscle, fascia and tendon of the posterior muscle group at thigh level, left thigh, subsequent encounter: Secondary | ICD-10-CM | POA: Diagnosis not present

## 2019-06-28 DIAGNOSIS — M6281 Muscle weakness (generalized): Secondary | ICD-10-CM | POA: Diagnosis not present

## 2019-06-30 DIAGNOSIS — M6281 Muscle weakness (generalized): Secondary | ICD-10-CM | POA: Diagnosis not present

## 2019-06-30 DIAGNOSIS — S76312D Strain of muscle, fascia and tendon of the posterior muscle group at thigh level, left thigh, subsequent encounter: Secondary | ICD-10-CM | POA: Diagnosis not present

## 2019-07-04 DIAGNOSIS — S76312D Strain of muscle, fascia and tendon of the posterior muscle group at thigh level, left thigh, subsequent encounter: Secondary | ICD-10-CM | POA: Diagnosis not present

## 2019-07-04 DIAGNOSIS — M6281 Muscle weakness (generalized): Secondary | ICD-10-CM | POA: Diagnosis not present

## 2019-07-06 DIAGNOSIS — S76312D Strain of muscle, fascia and tendon of the posterior muscle group at thigh level, left thigh, subsequent encounter: Secondary | ICD-10-CM | POA: Diagnosis not present

## 2019-07-06 DIAGNOSIS — M6281 Muscle weakness (generalized): Secondary | ICD-10-CM | POA: Diagnosis not present

## 2019-07-12 DIAGNOSIS — S62341A Nondisplaced fracture of base of second metacarpal bone. left hand, initial encounter for closed fracture: Secondary | ICD-10-CM | POA: Diagnosis not present

## 2019-07-12 DIAGNOSIS — M6281 Muscle weakness (generalized): Secondary | ICD-10-CM | POA: Diagnosis not present

## 2019-07-12 DIAGNOSIS — S62343A Nondisplaced fracture of base of third metacarpal bone, left hand, initial encounter for closed fracture: Secondary | ICD-10-CM | POA: Diagnosis not present

## 2019-07-12 DIAGNOSIS — S76312D Strain of muscle, fascia and tendon of the posterior muscle group at thigh level, left thigh, subsequent encounter: Secondary | ICD-10-CM | POA: Diagnosis not present

## 2019-07-14 DIAGNOSIS — S76312D Strain of muscle, fascia and tendon of the posterior muscle group at thigh level, left thigh, subsequent encounter: Secondary | ICD-10-CM | POA: Diagnosis not present

## 2019-07-14 DIAGNOSIS — M6281 Muscle weakness (generalized): Secondary | ICD-10-CM | POA: Diagnosis not present

## 2019-07-19 DIAGNOSIS — S76312D Strain of muscle, fascia and tendon of the posterior muscle group at thigh level, left thigh, subsequent encounter: Secondary | ICD-10-CM | POA: Diagnosis not present

## 2019-07-19 DIAGNOSIS — M6281 Muscle weakness (generalized): Secondary | ICD-10-CM | POA: Diagnosis not present

## 2019-07-26 DIAGNOSIS — S62343A Nondisplaced fracture of base of third metacarpal bone, left hand, initial encounter for closed fracture: Secondary | ICD-10-CM | POA: Diagnosis not present

## 2019-07-26 DIAGNOSIS — S62341A Nondisplaced fracture of base of second metacarpal bone. left hand, initial encounter for closed fracture: Secondary | ICD-10-CM | POA: Diagnosis not present

## 2019-07-29 DIAGNOSIS — M6281 Muscle weakness (generalized): Secondary | ICD-10-CM | POA: Diagnosis not present

## 2019-07-29 DIAGNOSIS — S76312D Strain of muscle, fascia and tendon of the posterior muscle group at thigh level, left thigh, subsequent encounter: Secondary | ICD-10-CM | POA: Diagnosis not present

## 2019-08-01 DIAGNOSIS — S76312D Strain of muscle, fascia and tendon of the posterior muscle group at thigh level, left thigh, subsequent encounter: Secondary | ICD-10-CM | POA: Diagnosis not present

## 2019-08-01 DIAGNOSIS — M6281 Muscle weakness (generalized): Secondary | ICD-10-CM | POA: Diagnosis not present

## 2019-08-09 DIAGNOSIS — R42 Dizziness and giddiness: Secondary | ICD-10-CM | POA: Diagnosis not present

## 2019-08-09 DIAGNOSIS — S62341D Nondisplaced fracture of base of second metacarpal bone. left hand, subsequent encounter for fracture with routine healing: Secondary | ICD-10-CM | POA: Diagnosis not present

## 2019-08-09 DIAGNOSIS — R7301 Impaired fasting glucose: Secondary | ICD-10-CM | POA: Diagnosis not present

## 2019-08-09 DIAGNOSIS — E039 Hypothyroidism, unspecified: Secondary | ICD-10-CM | POA: Diagnosis not present

## 2019-08-11 DIAGNOSIS — M25642 Stiffness of left hand, not elsewhere classified: Secondary | ICD-10-CM | POA: Diagnosis not present

## 2019-08-11 DIAGNOSIS — S62343D Nondisplaced fracture of base of third metacarpal bone, left hand, subsequent encounter for fracture with routine healing: Secondary | ICD-10-CM | POA: Diagnosis not present

## 2019-08-11 DIAGNOSIS — S62341D Nondisplaced fracture of base of second metacarpal bone. left hand, subsequent encounter for fracture with routine healing: Secondary | ICD-10-CM | POA: Diagnosis not present

## 2019-08-15 DIAGNOSIS — S62341D Nondisplaced fracture of base of second metacarpal bone. left hand, subsequent encounter for fracture with routine healing: Secondary | ICD-10-CM | POA: Diagnosis not present

## 2019-08-15 DIAGNOSIS — S62343D Nondisplaced fracture of base of third metacarpal bone, left hand, subsequent encounter for fracture with routine healing: Secondary | ICD-10-CM | POA: Diagnosis not present

## 2019-08-15 DIAGNOSIS — M25642 Stiffness of left hand, not elsewhere classified: Secondary | ICD-10-CM | POA: Diagnosis not present

## 2019-08-16 DIAGNOSIS — R7301 Impaired fasting glucose: Secondary | ICD-10-CM | POA: Diagnosis not present

## 2019-08-16 DIAGNOSIS — E785 Hyperlipidemia, unspecified: Secondary | ICD-10-CM | POA: Diagnosis not present

## 2019-08-16 DIAGNOSIS — E039 Hypothyroidism, unspecified: Secondary | ICD-10-CM | POA: Diagnosis not present

## 2019-08-17 DIAGNOSIS — S62341D Nondisplaced fracture of base of second metacarpal bone. left hand, subsequent encounter for fracture with routine healing: Secondary | ICD-10-CM | POA: Diagnosis not present

## 2019-08-17 DIAGNOSIS — M25642 Stiffness of left hand, not elsewhere classified: Secondary | ICD-10-CM | POA: Diagnosis not present

## 2019-08-17 DIAGNOSIS — S62343D Nondisplaced fracture of base of third metacarpal bone, left hand, subsequent encounter for fracture with routine healing: Secondary | ICD-10-CM | POA: Diagnosis not present

## 2019-08-22 DIAGNOSIS — M25642 Stiffness of left hand, not elsewhere classified: Secondary | ICD-10-CM | POA: Diagnosis not present

## 2019-08-22 DIAGNOSIS — S62343D Nondisplaced fracture of base of third metacarpal bone, left hand, subsequent encounter for fracture with routine healing: Secondary | ICD-10-CM | POA: Diagnosis not present

## 2019-08-22 DIAGNOSIS — S62341D Nondisplaced fracture of base of second metacarpal bone. left hand, subsequent encounter for fracture with routine healing: Secondary | ICD-10-CM | POA: Diagnosis not present

## 2019-08-23 DIAGNOSIS — S62341D Nondisplaced fracture of base of second metacarpal bone. left hand, subsequent encounter for fracture with routine healing: Secondary | ICD-10-CM | POA: Diagnosis not present

## 2019-10-11 DIAGNOSIS — S62341D Nondisplaced fracture of base of second metacarpal bone. left hand, subsequent encounter for fracture with routine healing: Secondary | ICD-10-CM | POA: Diagnosis not present

## 2019-10-11 DIAGNOSIS — E039 Hypothyroidism, unspecified: Secondary | ICD-10-CM | POA: Diagnosis not present

## 2019-10-12 DIAGNOSIS — H5213 Myopia, bilateral: Secondary | ICD-10-CM | POA: Diagnosis not present

## 2019-10-13 DIAGNOSIS — Z0100 Encounter for examination of eyes and vision without abnormal findings: Secondary | ICD-10-CM | POA: Diagnosis not present

## 2019-11-08 DIAGNOSIS — S62341D Nondisplaced fracture of base of second metacarpal bone. left hand, subsequent encounter for fracture with routine healing: Secondary | ICD-10-CM | POA: Diagnosis not present

## 2019-12-14 DIAGNOSIS — E039 Hypothyroidism, unspecified: Secondary | ICD-10-CM | POA: Diagnosis not present

## 2020-01-30 DIAGNOSIS — R7301 Impaired fasting glucose: Secondary | ICD-10-CM | POA: Diagnosis not present

## 2020-01-30 DIAGNOSIS — E039 Hypothyroidism, unspecified: Secondary | ICD-10-CM | POA: Diagnosis not present

## 2020-01-30 DIAGNOSIS — E785 Hyperlipidemia, unspecified: Secondary | ICD-10-CM | POA: Diagnosis not present

## 2020-02-17 DIAGNOSIS — E785 Hyperlipidemia, unspecified: Secondary | ICD-10-CM | POA: Diagnosis not present

## 2020-02-17 DIAGNOSIS — E039 Hypothyroidism, unspecified: Secondary | ICD-10-CM | POA: Diagnosis not present

## 2020-02-17 DIAGNOSIS — R635 Abnormal weight gain: Secondary | ICD-10-CM | POA: Diagnosis not present

## 2020-02-17 DIAGNOSIS — R7301 Impaired fasting glucose: Secondary | ICD-10-CM | POA: Diagnosis not present

## 2020-02-22 DIAGNOSIS — E039 Hypothyroidism, unspecified: Secondary | ICD-10-CM | POA: Diagnosis not present

## 2020-02-22 DIAGNOSIS — R635 Abnormal weight gain: Secondary | ICD-10-CM | POA: Diagnosis not present

## 2020-02-22 DIAGNOSIS — R7301 Impaired fasting glucose: Secondary | ICD-10-CM | POA: Diagnosis not present

## 2020-02-24 DIAGNOSIS — R635 Abnormal weight gain: Secondary | ICD-10-CM | POA: Diagnosis not present

## 2020-03-09 DIAGNOSIS — Z1231 Encounter for screening mammogram for malignant neoplasm of breast: Secondary | ICD-10-CM | POA: Diagnosis not present

## 2020-03-28 DIAGNOSIS — E039 Hypothyroidism, unspecified: Secondary | ICD-10-CM | POA: Diagnosis not present

## 2020-05-02 DIAGNOSIS — M67912 Unspecified disorder of synovium and tendon, left shoulder: Secondary | ICD-10-CM | POA: Diagnosis not present

## 2020-05-29 ENCOUNTER — Other Ambulatory Visit: Payer: Self-pay

## 2020-05-29 ENCOUNTER — Ambulatory Visit: Payer: Medicare HMO | Admitting: Podiatry

## 2020-05-29 VITALS — BP 135/82 | HR 65 | Temp 98.4°F

## 2020-05-29 DIAGNOSIS — B351 Tinea unguium: Secondary | ICD-10-CM | POA: Diagnosis not present

## 2020-05-29 DIAGNOSIS — Z79899 Other long term (current) drug therapy: Secondary | ICD-10-CM | POA: Diagnosis not present

## 2020-05-29 DIAGNOSIS — L603 Nail dystrophy: Secondary | ICD-10-CM | POA: Diagnosis not present

## 2020-05-29 LAB — CBC WITH DIFFERENTIAL/PLATELET
HCT: 42.2 % (ref 35.0–45.0)
Hemoglobin: 13.9 g/dL (ref 11.7–15.5)
MCH: 28.6 pg (ref 27.0–33.0)
MCHC: 32.9 g/dL (ref 32.0–36.0)
MPV: 10.5 fL (ref 7.5–12.5)

## 2020-05-29 NOTE — Patient Instructions (Signed)
Terbinafine oral granules What is this medicine? TERBINAFINE (TER bin a feen) is an antifungal medicine. It is used to treat certain kinds of fungal or yeast infections. This medicine may be used for other purposes; ask your health care provider or pharmacist if you have questions. COMMON BRAND NAME(S): Lamisil What should I tell my health care provider before I take this medicine? They need to know if you have any of these conditions:  drink alcoholic beverages  kidney disease  liver disease  an unusual or allergic reaction to Terbinafine, other medicines, foods, dyes, or preservatives  pregnant or trying to get pregnant  breast-feeding How should I use this medicine? Take this medicine by mouth. Follow the directions on the prescription label. Hold packet with cut line on top. Shake packet gently to settle contents. Tear packet open along cut line, or use scissors to cut across line. Carefully pour the entire contents of packet onto a spoonful of a soft food, such as pudding or other soft, non-acidic food such as mashed potatoes (do NOT use applesauce or a fruit-based food). If two packets are required for each dose, you may either sprinkle the content of both packets on one spoonful of non-acidic food, or sprinkle the contents of both packets on two spoonfuls of non-acidic food. Make sure that no granules remain in the packet. Swallow the mxiture of the food and granules without chewing. Take your medicine at regular intervals. Do not take it more often than directed. Take all of your medicine as directed even if you think you are better. Do not skip doses or stop your medicine early. Contact your pediatrician or health care professional regarding the use of this medicine in children. While this medicine may be prescribed for children as young as 4 years for selected conditions, precautions do apply. Overdosage: If you think you have taken too much of this medicine contact a poison control  center or emergency room at once. NOTE: This medicine is only for you. Do not share this medicine with others. What if I miss a dose? If you miss a dose, take it as soon as you can. If it is almost time for your next dose, take only that dose. Do not take double or extra doses. What may interact with this medicine? Do not take this medicine with any of the following medications:  thioridazine This medicine may also interact with the following medications:  beta-blockers  caffeine  cimetidine  cyclosporine  MAOIs like Carbex, Eldepryl, Marplan, Nardil, and Parnate  medicines for fungal infections like fluconazole and ketoconazole  medicines for irregular heartbeat like amiodarone, flecainide and propafenone  rifampin  SSRIs like citalopram, escitalopram, fluoxetine, fluvoxamine, paroxetine and sertraline  tricyclic antidepressants like amitriptyline, clomipramine, desipramine, imipramine, nortriptyline, and others  warfarin This list may not describe all possible interactions. Give your health care provider a list of all the medicines, herbs, non-prescription drugs, or dietary supplements you use. Also tell them if you smoke, drink alcohol, or use illegal drugs. Some items may interact with your medicine. What should I watch for while using this medicine? Your doctor may monitor your liver function. Tell your doctor right away if you have nausea or vomiting, loss of appetite, stomach pain on your right upper side, yellow skin, dark urine, light stools, or are over tired. This medicine may cause serious skin reactions. They can happen weeks to months after starting the medicine. Contact your health care provider right away if you notice fevers or flu-like symptoms   with a rash. The rash may be red or purple and then turn into blisters or peeling of the skin. Or, you might notice a red rash with swelling of the face, lips or lymph nodes in your neck or under your arms. You need to take  this medicine for 6 weeks or longer to cure the fungal infection. Take your medicine regularly for as long as your doctor or health care provider tells you to. What side effects may I notice from receiving this medicine? Side effects that you should report to your doctor or health care professional as soon as possible:  allergic reactions like skin rash or hives, swelling of the face, lips, or tongue  change in vision  dark urine  fever or infection  general ill feeling or flu-like symptoms  light-colored stools  loss of appetite, nausea  rash, fever, and swollen lymph nodes  redness, blistering, peeling or loosening of the skin, including inside the mouth  right upper belly pain  unusually weak or tired  yellowing of the eyes or skin Side effects that usually do not require medical attention (report to your doctor or health care professional if they continue or are bothersome):  changes in taste  diarrhea  hair loss  muscle or joint pain  stomach upset This list may not describe all possible side effects. Call your doctor for medical advice about side effects. You may report side effects to FDA at 1-800-FDA-1088. Where should I keep my medicine? Keep out of the reach of children. Store at room temperature between 15 and 30 degrees C (59 and 86 degrees F). Throw away any unused medicine after the expiration date. NOTE: This sheet is a summary. It may not cover all possible information. If you have questions about this medicine, talk to your doctor, pharmacist, or health care provider.  2020 Elsevier/Gold Standard (2018-10-15 15:35:11)  

## 2020-05-30 LAB — CBC WITH DIFFERENTIAL/PLATELET
Absolute Monocytes: 598 cells/uL (ref 200–950)
Basophils Absolute: 50 cells/uL (ref 0–200)
Basophils Relative: 0.7 %
Eosinophils Absolute: 122 cells/uL (ref 15–500)
Eosinophils Relative: 1.7 %
Lymphs Abs: 1829 cells/uL (ref 850–3900)
MCV: 86.8 fL (ref 80.0–100.0)
Monocytes Relative: 8.3 %
Neutro Abs: 4601 cells/uL (ref 1500–7800)
Neutrophils Relative %: 63.9 %
Platelets: 277 10*3/uL (ref 140–400)
RBC: 4.86 10*6/uL (ref 3.80–5.10)
RDW: 12.8 % (ref 11.0–15.0)
Total Lymphocyte: 25.4 %
WBC: 7.2 10*3/uL (ref 3.8–10.8)

## 2020-05-30 LAB — HEPATIC FUNCTION PANEL
AG Ratio: 2.3 (calc) (ref 1.0–2.5)
ALT: 12 U/L (ref 6–29)
AST: 17 U/L (ref 10–35)
Albumin: 4.9 g/dL (ref 3.6–5.1)
Alkaline phosphatase (APISO): 74 U/L (ref 37–153)
Bilirubin, Direct: 0.1 mg/dL (ref 0.0–0.2)
Globulin: 2.1 g/dL (calc) (ref 1.9–3.7)
Indirect Bilirubin: 0.4 mg/dL (calc) (ref 0.2–1.2)
Total Bilirubin: 0.5 mg/dL (ref 0.2–1.2)
Total Protein: 7 g/dL (ref 6.1–8.1)

## 2020-05-31 ENCOUNTER — Encounter: Payer: Self-pay | Admitting: Podiatry

## 2020-05-31 ENCOUNTER — Other Ambulatory Visit: Payer: Self-pay | Admitting: Podiatry

## 2020-05-31 DIAGNOSIS — M67912 Unspecified disorder of synovium and tendon, left shoulder: Secondary | ICD-10-CM | POA: Diagnosis not present

## 2020-05-31 DIAGNOSIS — Z79899 Other long term (current) drug therapy: Secondary | ICD-10-CM

## 2020-05-31 MED ORDER — TERBINAFINE HCL 250 MG PO TABS: 250.0000 mg | ORAL_TABLET | Freq: Every day | ORAL | 0 refills | Status: DC

## 2020-06-05 NOTE — Progress Notes (Signed)
Also does notSubjective:   Patient ID: Michaela Elliott, female   DOB: 69 y.o.   MRN: [id number]   HPI 69 year old female presents the office with concerns of coming off transferred Significant discomfort.  She currently has no pain for now since she denies any redness drainage or any signs of infection.  She said no recent treatment.  She has no other concerns today.  She does play tennis on a regular basis.   Review of Systems  All other systems reviewed and are negative.  Past Medical History:  Diagnosis Date  . Anxiety   . Hypothyroid   . Vitamin D deficiency    hx of    Past Surgical History:  Procedure Laterality Date  . CESAREAN SECTION     x3     Current Outpatient Medications:  .  ALPRAZolam (XANAX) 0.5 MG tablet, Take 0.5 mg by mouth at bedtime as needed for anxiety., Disp: , Rfl:  .  erythromycin with ethanol (EMGEL) 2 % gel, Apply topically daily., Disp: , Rfl:  .  Multiple Vitamins-Minerals (MULTIVITAMIN WITH MINERALS) tablet, Take 1 tablet by mouth daily., Disp: , Rfl:  .  Omega-3 1400 MG CAPS, Take by mouth., Disp: , Rfl:  .  terbinafine (LAMISIL) 250 MG tablet, Take 1 tablet (250 mg total) by mouth daily., Disp: 90 tablet, Rfl: 0 .  thyroid (ARMOUR) 90 MG tablet, Take 90 mg by mouth daily., Disp: , Rfl:  .  UNABLE TO FIND, alamax-cr  - 1 daily with meal, Disp: , Rfl:  .  UNABLE TO FIND, Solray D 1000 D/125/k2/spray  - 3 sprays twice a day sublingual, Disp: , Rfl:  .  vitamin B-12 (CYANOCOBALAMIN) 1000 MCG tablet, Take 1,000 mcg by mouth daily., Disp: , Rfl:  .  vitamin E 400 UNIT capsule, Take 400 Units by mouth daily., Disp: , Rfl:   No Known Allergies       Objective:  Physical Exam  General: AAO x3, NAD  Dermatological: Bilateral hallux nails are hypertrophic, dystrophic with yellow-brown discoloration.  Lesser digit nails have,, still appear to be somewhat dystrophic.  There is no pain the nails there is no redness or drainage and signs of infection  noted today.  There is no open lesions.  Vascular: Dorsalis Pedis artery and Posterior Tibial artery pedal pulses are 2/4 bilateral with immedate capillary fill time. There is no pain with calf compression, swelling, warmth, erythema.   Neruologic: Grossly intact via light touch bilateral.   Musculoskeletal: No gross boney pedal deformities bilateral. No pain, crepitus, or limitation noted with foot and ankle range of motion bilateral. Muscular strength 5/5 in all groups tested bilateral.  Gait: Unassisted, Nonantalgic.       Assessment:   Onychomycosis     Plan:  -Treatment options discussed including all alternatives, risks, and complications -Etiology of symptoms were discussed -Discussed various treatment options occluding medication nonmedication options.  Discussed oral and topical medications.  This time she has received oral Lamisil.  Will check a CBC and LFT prior to starting the medication.  We discussed side effects and success rates. -Also part of the issue with the nails could be coming from shoes, tenderness and dystrophy of the nail.  Trula Slade DPM

## 2020-06-07 ENCOUNTER — Encounter: Payer: Self-pay | Admitting: Obstetrics & Gynecology

## 2020-06-07 ENCOUNTER — Other Ambulatory Visit: Payer: Self-pay

## 2020-06-07 ENCOUNTER — Ambulatory Visit (INDEPENDENT_AMBULATORY_CARE_PROVIDER_SITE_OTHER): Payer: Medicare HMO | Admitting: Obstetrics & Gynecology

## 2020-06-07 VITALS — BP 140/80 | Ht 66.0 in | Wt 150.0 lb

## 2020-06-07 DIAGNOSIS — M8589 Other specified disorders of bone density and structure, multiple sites: Secondary | ICD-10-CM

## 2020-06-07 DIAGNOSIS — Z01419 Encounter for gynecological examination (general) (routine) without abnormal findings: Secondary | ICD-10-CM

## 2020-06-07 DIAGNOSIS — Z780 Asymptomatic menopausal state: Secondary | ICD-10-CM

## 2020-06-07 NOTE — Progress Notes (Signed)
Michaela Elliott Aug 11, 1950 [id number]   History:    69 y.o. G3P3L3  Boyfriend x 3 yrs.  Retired.  RP:  Established patient presenting for annual gyn exam   HPI: Postmenopause, well on no HRT.  No PMB.  No pelvic pain.  Rarely sexually active.  Urine/BMs normal.  Breasts normal.  BMI 24.21.  Very fit, doing palatis and tennis.  Healthy nutrition. Hypothyroidism well controled now, followed by Dr Chalmers Cater.  Health labs with Dr Chalmers Cater.  Past medical history,surgical history, family history and social history were all reviewed and documented in the EPIC chart.  Gynecologic History No LMP recorded. Patient is postmenopausal.  Obstetric History OB History  Gravida Para Term Preterm AB Living  3 3     0 3  SAB TAB Ectopic Multiple Live Births      0        # Outcome Date GA Lbr Len/2nd Weight Sex Delivery Anes PTL Lv  3 Para           2 Para           1 Para              ROS: A ROS was performed and pertinent positives and negatives are included in the history.  GENERAL: No fevers or chills. HEENT: No change in vision, no earache, sore throat or sinus congestion. NECK: No pain or stiffness. CARDIOVASCULAR: No chest pain or pressure. No palpitations. PULMONARY: No shortness of breath, cough or wheeze. GASTROINTESTINAL: No abdominal pain, nausea, vomiting or diarrhea, melena or bright red blood per rectum. GENITOURINARY: No urinary frequency, urgency, hesitancy or dysuria. MUSCULOSKELETAL: No joint or muscle pain, no back pain, no recent trauma. DERMATOLOGIC: No rash, no itching, no lesions. ENDOCRINE: No polyuria, polydipsia, no heat or cold intolerance. No recent change in weight. HEMATOLOGICAL: No anemia or easy bruising or bleeding. NEUROLOGIC: No headache, seizures, numbness, tingling or weakness. PSYCHIATRIC: No depression, no loss of interest in normal activity or change in sleep pattern.     Exam:   BP 140/80    Ht 5\' 6"  (1.676 m)    Wt 150 lb (68 kg)    BMI 24.21 kg/m   Body mass  index is 24.21 kg/m.  General appearance : Well developed well nourished female. No acute distress HEENT: Eyes: no retinal hemorrhage or exudates,  Neck supple, trachea midline, no carotid bruits, no thyroidmegaly Lungs: Clear to auscultation, no rhonchi or wheezes, or rib retractions  Heart: Regular rate and rhythm, no murmurs or gallops Breast:Examined in sitting and supine position were symmetrical in appearance, no palpable masses or tenderness,  no skin retraction, no nipple inversion, no nipple discharge, no skin discoloration, no axillary or supraclavicular lymphadenopathy Abdomen: no palpable masses or tenderness, no rebound or guarding Extremities: no edema or skin discoloration or tenderness  Pelvic: Vulva: Normal             Vagina: No gross lesions or discharge  Cervix: No gross lesions or discharge  Uterus  AV, normal size, shape and consistency, non-tender and mobile  Adnexa  Without masses or tenderness  Anus: Normal   Assessment/Plan:  69 y.o. female for annual exam   1. Well female exam with routine gynecological exam Normal gynecologic exam in menopause.  No indication for Pap test this year.  Breast exam normal. Will schedule a screening mammogram now.  Colonoscopy in 2014.  Good body mass index at 24.21.  Continue with fitness and healthy nutrition.  Fasting health labs with Dr. Michiel Sites who follows her hypothyroidism on Armour Synthroid.  2. Postmenopause Well on no hormone replacement therapy.  No postmenopausal bleeding.  3. Osteopenia of multiple sites Schedule BD at [facility].  Vitamin D supplements, calcium intake of 1500 mg daily and regular weightbearing physical activities.  Princess Bruins MD, 3:44 PM 06/07/2020

## 2020-06-11 ENCOUNTER — Telehealth: Payer: Self-pay | Admitting: *Deleted

## 2020-06-11 NOTE — Telephone Encounter (Signed)
Order faxed to [phone] to [facility]

## 2020-06-11 NOTE — Telephone Encounter (Signed)
-----   Message from Princess Bruins, MD sent at 06/07/2020  4:07 PM EST ----- Regarding: Schedule Bone Density at [facility] Osteopenia.  Schedule BD at [facility].

## 2020-06-13 ENCOUNTER — Encounter: Payer: Self-pay | Admitting: Obstetrics & Gynecology

## 2020-06-21 DIAGNOSIS — M67912 Unspecified disorder of synovium and tendon, left shoulder: Secondary | ICD-10-CM | POA: Diagnosis not present

## 2020-07-06 DIAGNOSIS — Z79899 Other long term (current) drug therapy: Secondary | ICD-10-CM | POA: Diagnosis not present

## 2020-07-07 LAB — HEPATIC FUNCTION PANEL
AG Ratio: 1.8 (calc) (ref 1.0–2.5)
ALT: 15 U/L (ref 6–29)
AST: 17 U/L (ref 10–35)
Albumin: 4.5 g/dL (ref 3.6–5.1)
Alkaline phosphatase (APISO): 79 U/L (ref 37–153)
Bilirubin, Direct: 0.1 mg/dL (ref 0.0–0.2)
Globulin: 2.5 g/dL (calc) (ref 1.9–3.7)
Indirect Bilirubin: 0.4 mg/dL (calc) (ref 0.2–1.2)
Total Bilirubin: 0.5 mg/dL (ref 0.2–1.2)
Total Protein: 7 g/dL (ref 6.1–8.1)

## 2020-07-07 LAB — CBC WITH DIFFERENTIAL/PLATELET
Absolute Monocytes: 508 cells/uL (ref 200–950)
Basophils Absolute: 81 cells/uL (ref 0–200)
Basophils Relative: 1.5 %
Eosinophils Absolute: 200 cells/uL (ref 15–500)
Eosinophils Relative: 3.7 %
HCT: 41.4 % (ref 35.0–45.0)
Hemoglobin: 13.5 g/dL (ref 11.7–15.5)
Lymphs Abs: 1534 cells/uL (ref 850–3900)
MCH: 28.1 pg (ref 27.0–33.0)
MCHC: 32.6 g/dL (ref 32.0–36.0)
MCV: 86.3 fL (ref 80.0–100.0)
MPV: 10.5 fL (ref 7.5–12.5)
Monocytes Relative: 9.4 %
Neutro Abs: 3078 cells/uL (ref 1500–7800)
Neutrophils Relative %: 57 %
Platelets: 283 10*3/uL (ref 140–400)
RBC: 4.8 10*6/uL (ref 3.80–5.10)
RDW: 12.8 % (ref 11.0–15.0)
Total Lymphocyte: 28.4 %
WBC: 5.4 10*3/uL (ref 3.8–10.8)

## 2020-07-23 ENCOUNTER — Encounter: Payer: Medicare HMO | Admitting: Obstetrics & Gynecology

## 2020-07-30 ENCOUNTER — Ambulatory Visit: Payer: Self-pay

## 2020-07-30 ENCOUNTER — Ambulatory Visit: Payer: Medicare HMO | Admitting: Family Medicine

## 2020-07-30 ENCOUNTER — Other Ambulatory Visit: Payer: Self-pay

## 2020-07-30 VITALS — BP 116/70 | Ht 66.0 in | Wt 145.0 lb

## 2020-07-30 DIAGNOSIS — M19019 Primary osteoarthritis, unspecified shoulder: Secondary | ICD-10-CM | POA: Insufficient documentation

## 2020-07-30 DIAGNOSIS — G8929 Other chronic pain: Secondary | ICD-10-CM

## 2020-07-30 DIAGNOSIS — M25512 Pain in left shoulder: Secondary | ICD-10-CM

## 2020-07-30 DIAGNOSIS — M778 Other enthesopathies, not elsewhere classified: Secondary | ICD-10-CM | POA: Diagnosis not present

## 2020-07-30 MED ORDER — METHYLPREDNISOLONE ACETATE 40 MG/ML IJ SUSP: 40.0000 mg | Freq: Once | INTRAMUSCULAR | Status: DC

## 2020-07-30 MED ORDER — TRIAMCINOLONE ACETONIDE 40 MG/ML IJ SUSP
40.0000 mg | Freq: Once | INTRAMUSCULAR | Status: AC
  Administered 2020-07-30: 40 mg via INTRA_ARTICULAR

## 2020-07-30 NOTE — Assessment & Plan Note (Signed)
Acute on chronic in nature.  Symptoms seem most consistent with a capsulitis following her fall from a year ago. -Counseled on home exercise therapy and supportive care. -Injection. -Could consider physical therapy or further imaging.

## 2020-07-30 NOTE — Progress Notes (Signed)
Michaela Elliott - 70 y.o. female MRN [id number]  Date of birth: 15-Jan-1951  SUBJECTIVE:  Including CC & ROS.  No chief complaint on file.   Michaela Elliott is a 70 y.o. female that is presenting with acute on chronic left shoulder pain.  Pain is been ongoing for over a year.  She noticed the pain after she fell and broke her hand.  She was in a sling for a period of a couple months.  Has had a subacromial injection and a intramuscular injection.  Symptoms are still occurring.  Has not had physical therapy.  X-rays have been negative.   Review of Systems See HPI   HISTORY: Past Medical, Surgical, Social, and Family History Reviewed & Updated per EMR.   Pertinent Historical Findings include:  Past Medical History:  Diagnosis Date  . Anxiety   . Hypothyroid   . Vitamin D deficiency    hx of    Past Surgical History:  Procedure Laterality Date  . CESAREAN SECTION     x3    Family History  Problem Relation Age of Onset  . Heart disease Father   . Heart attack Mother   . Ovarian cancer Maternal Grandmother   . Stroke Maternal Grandfather     Social History   Socioeconomic History  . Marital status: Single    Spouse name: Not on file  . Number of children: Not on file  . Years of education: Not on file  . Highest education level: Not on file  Occupational History  . Not on file  Tobacco Use  . Smoking status: Never Smoker  . Smokeless tobacco: Never Used  Vaping Use  . Vaping Use: Never used  Substance and Sexual Activity  . Alcohol use: Yes  . Drug use: Never  . Sexual activity: Yes    Birth control/protection: None    Comment: intercourse age 29.,less than 5 sexual partners,des neg  Other Topics Concern  . Not on file  Social History Narrative  . Not on file   Social Determinants of Health   Financial Resource Strain: Not on file  Food Insecurity: Not on file  Transportation Needs: Not on file  Physical Activity: Not on file  Stress: Not on file  Social  Connections: Not on file  Intimate Partner Violence: Not on file     PHYSICAL EXAM:  VS: BP 116/70   Ht 5\' 6"  (1.676 m)   Wt 145 lb (65.8 kg)   BMI 23.40 kg/m  Physical Exam Gen: NAD, alert, cooperative with exam, well-appearing MSK:  Left shoulder: Normal internal rotation. Limited external rotation with abduction. Limited flexion. Normal empty can testing. Positive O'Brien's test. Neurovascularly intact   Limited ultrasound: Left shoulder:  Normal-appearing biceps tendon. Normal subscapularis. Mild degenerative changes of the supraspinatus with intratendinous calcifications.  No hyperemia. Normal-appearing posterior glenohumeral joint. Second capsule of the left axilla compared to the right axilla.  Summary: Findings consistent with capsulitis.  Ultrasound and interpretation by Clearance Coots, MD   Aspiration/Injection Procedure Note Ziya Coonrod September 04, 1950  Procedure: Injection Indications: Left shoulder pain  Procedure Details Consent: Risks of procedure as well as the alternatives and risks of each were explained to the (patient/caregiver).  Consent for procedure obtained. Time Out: Verified patient identification, verified procedure, site/side was marked, verified correct patient position, special equipment/implants available, medications/allergies/relevent history reviewed, required imaging and test results available.  Performed.  The area was cleaned with iodine and alcohol swabs.    The left glenohumeral space was  injected using 3 cc of 1% lidocaine, 1 cc's of 40 mg Kenalog and 3 cc's of 0.25% bupivacaine with a 22 3 1/2" needle.  Ultrasound was used. Images were obtained in short views showing the injection.     A sterile dressing was applied.  Patient did tolerate procedure well.    ASSESSMENT & PLAN:   Capsulitis of left shoulder Acute on chronic in nature.  Symptoms seem most consistent with a capsulitis following her fall from a year  ago. -Counseled on home exercise therapy and supportive care. -Injection. -Could consider physical therapy or further imaging.

## 2020-07-30 NOTE — Patient Instructions (Signed)
Nice to meet you °Please try heat before the exercises and ice after  °Please try the exercises   °Please send me a message in MyChart with any questions or updates.  °Please see me back in 4 weeks.  ° °--Dr. Stephania Macfarlane ° °

## 2020-08-18 ENCOUNTER — Encounter: Payer: Self-pay | Admitting: Family Medicine

## 2020-08-18 ENCOUNTER — Encounter: Payer: Self-pay | Admitting: Podiatry

## 2020-08-20 NOTE — Telephone Encounter (Signed)
Re-schedule appt.

## 2020-08-30 ENCOUNTER — Ambulatory Visit: Payer: Medicare HMO | Admitting: Podiatry

## 2020-08-30 ENCOUNTER — Ambulatory Visit: Payer: Medicare HMO | Admitting: Family Medicine

## 2020-09-10 ENCOUNTER — Other Ambulatory Visit: Payer: Self-pay

## 2020-09-10 ENCOUNTER — Ambulatory Visit: Payer: Medicare HMO | Admitting: Podiatry

## 2020-09-10 DIAGNOSIS — B351 Tinea unguium: Secondary | ICD-10-CM

## 2020-09-10 DIAGNOSIS — Z79899 Other long term (current) drug therapy: Secondary | ICD-10-CM | POA: Diagnosis not present

## 2020-09-10 NOTE — Patient Instructions (Signed)
Terbinafine oral granules What is this medicine? TERBINAFINE (TER bin a feen) is an antifungal medicine. It is used to treat certain kinds of fungal or yeast infections. This medicine may be used for other purposes; ask your health care provider or pharmacist if you have questions. COMMON BRAND NAME(S): Lamisil What should I tell my health care provider before I take this medicine? They need to know if you have any of these conditions:  drink alcoholic beverages  kidney disease  liver disease  an unusual or allergic reaction to Terbinafine, other medicines, foods, dyes, or preservatives  pregnant or trying to get pregnant  breast-feeding How should I use this medicine? Take this medicine by mouth. Follow the directions on the prescription label. Hold packet with cut line on top. Shake packet gently to settle contents. Tear packet open along cut line, or use scissors to cut across line. Carefully pour the entire contents of packet onto a spoonful of a soft food, such as pudding or other soft, non-acidic food such as mashed potatoes (do NOT use applesauce or a fruit-based food). If two packets are required for each dose, you may either sprinkle the content of both packets on one spoonful of non-acidic food, or sprinkle the contents of both packets on two spoonfuls of non-acidic food. Make sure that no granules remain in the packet. Swallow the mxiture of the food and granules without chewing. Take your medicine at regular intervals. Do not take it more often than directed. Take all of your medicine as directed even if you think you are better. Do not skip doses or stop your medicine early. Contact your pediatrician or health care professional regarding the use of this medicine in children. While this medicine may be prescribed for children as young as 4 years for selected conditions, precautions do apply. Overdosage: If you think you have taken too much of this medicine contact a poison control  center or emergency room at once. NOTE: This medicine is only for you. Do not share this medicine with others. What if I miss a dose? If you miss a dose, take it as soon as you can. If it is almost time for your next dose, take only that dose. Do not take double or extra doses. What may interact with this medicine? Do not take this medicine with any of the following medications:  thioridazine This medicine may also interact with the following medications:  beta-blockers  caffeine  cimetidine  cyclosporine  MAOIs like Carbex, Eldepryl, Marplan, Nardil, and Parnate  medicines for fungal infections like fluconazole and ketoconazole  medicines for irregular heartbeat like amiodarone, flecainide and propafenone  rifampin  SSRIs like citalopram, escitalopram, fluoxetine, fluvoxamine, paroxetine and sertraline  tricyclic antidepressants like amitriptyline, clomipramine, desipramine, imipramine, nortriptyline, and others  warfarin This list may not describe all possible interactions. Give your health care provider a list of all the medicines, herbs, non-prescription drugs, or dietary supplements you use. Also tell them if you smoke, drink alcohol, or use illegal drugs. Some items may interact with your medicine. What should I watch for while using this medicine? Your doctor may monitor your liver function. Tell your doctor right away if you have nausea or vomiting, loss of appetite, stomach pain on your right upper side, yellow skin, dark urine, light stools, or are over tired. This medicine may cause serious skin reactions. They can happen weeks to months after starting the medicine. Contact your health care provider right away if you notice fevers or flu-like symptoms   with a rash. The rash may be red or purple and then turn into blisters or peeling of the skin. Or, you might notice a red rash with swelling of the face, lips or lymph nodes in your neck or under your arms. You need to take  this medicine for 6 weeks or longer to cure the fungal infection. Take your medicine regularly for as long as your doctor or health care provider tells you to. What side effects may I notice from receiving this medicine? Side effects that you should report to your doctor or health care professional as soon as possible:  allergic reactions like skin rash or hives, swelling of the face, lips, or tongue  change in vision  dark urine  fever or infection  general ill feeling or flu-like symptoms  light-colored stools  loss of appetite, nausea  rash, fever, and swollen lymph nodes  redness, blistering, peeling or loosening of the skin, including inside the mouth  right upper belly pain  unusually weak or tired  yellowing of the eyes or skin Side effects that usually do not require medical attention (report to your doctor or health care professional if they continue or are bothersome):  changes in taste  diarrhea  hair loss  muscle or joint pain  stomach upset This list may not describe all possible side effects. Call your doctor for medical advice about side effects. You may report side effects to FDA at 1-800-FDA-1088. Where should I keep my medicine? Keep out of the reach of children. Store at room temperature between 15 and 30 degrees C (59 and 86 degrees F). Throw away any unused medicine after the expiration date. NOTE: This sheet is a summary. It may not cover all possible information. If you have questions about this medicine, talk to your doctor, pharmacist, or health care provider.  2021 Elsevier/Gold Standard (2018-10-15 15:35:11)  

## 2020-09-11 DIAGNOSIS — E785 Hyperlipidemia, unspecified: Secondary | ICD-10-CM | POA: Diagnosis not present

## 2020-09-11 DIAGNOSIS — R7301 Impaired fasting glucose: Secondary | ICD-10-CM | POA: Diagnosis not present

## 2020-09-11 DIAGNOSIS — Z79899 Other long term (current) drug therapy: Secondary | ICD-10-CM | POA: Diagnosis not present

## 2020-09-11 DIAGNOSIS — E039 Hypothyroidism, unspecified: Secondary | ICD-10-CM | POA: Diagnosis not present

## 2020-09-12 LAB — CBC WITH DIFFERENTIAL/PLATELET
Absolute Monocytes: 440 cells/uL (ref 200–950)
Basophils Absolute: 48 cells/uL (ref 0–200)
Basophils Relative: 0.9 %
Eosinophils Absolute: 58 cells/uL (ref 15–500)
Eosinophils Relative: 1.1 %
HCT: 41 % (ref 35.0–45.0)
Hemoglobin: 13.6 g/dL (ref 11.7–15.5)
Lymphs Abs: 1367 cells/uL (ref 850–3900)
MCH: 29 pg (ref 27.0–33.0)
MCHC: 33.2 g/dL (ref 32.0–36.0)
MCV: 87.4 fL (ref 80.0–100.0)
MPV: 10.4 fL (ref 7.5–12.5)
Monocytes Relative: 8.3 %
Neutro Abs: 3387 cells/uL (ref 1500–7800)
Neutrophils Relative %: 63.9 %
Platelets: 265 10*3/uL (ref 140–400)
RBC: 4.69 10*6/uL (ref 3.80–5.10)
RDW: 12.7 % (ref 11.0–15.0)
Total Lymphocyte: 25.8 %
WBC: 5.3 10*3/uL (ref 3.8–10.8)

## 2020-09-12 LAB — HEPATIC FUNCTION PANEL
AG Ratio: 2.1 (calc) (ref 1.0–2.5)
ALT: 18 U/L (ref 6–29)
AST: 16 U/L (ref 10–35)
Albumin: 4.7 g/dL (ref 3.6–5.1)
Alkaline phosphatase (APISO): 85 U/L (ref 37–153)
Bilirubin, Direct: 0.1 mg/dL (ref 0.0–0.2)
Globulin: 2.2 g/dL (calc) (ref 1.9–3.7)
Indirect Bilirubin: 0.3 mg/dL (calc) (ref 0.2–1.2)
Total Bilirubin: 0.4 mg/dL (ref 0.2–1.2)
Total Protein: 6.9 g/dL (ref 6.1–8.1)

## 2020-09-14 ENCOUNTER — Other Ambulatory Visit: Payer: Self-pay | Admitting: Podiatry

## 2020-09-14 MED ORDER — TERBINAFINE HCL 250 MG PO TABS: 250.0000 mg | ORAL_TABLET | Freq: Every day | ORAL | 0 refills | Status: DC

## 2020-09-15 DIAGNOSIS — B351 Tinea unguium: Secondary | ICD-10-CM | POA: Insufficient documentation

## 2020-09-15 NOTE — Progress Notes (Signed)
Subjective: 70 year old female presents the office today for follow-up evaluation of toenail fungus.  She just recently completed the course of Lamisil.  She does feel that the nails have been getting better and more clear in color.  She has no pain in the nails.  Some the nails have fallen off previously but have come back and looking better.  Still somewhat thickened discolored but has made good improvements. Denies any systemic complaints such as fevers, chills, nausea, vomiting. No acute changes since last appointment, and no other complaints at this time.   Objective: AAO x3, NAD DP/PT pulses palpable bilaterally, CRT less than 3 seconds Overall the nails appear to be much improved.  There is clearing of the nail but still some dystrophy discoloration present.  There is no pain there is no redness or drainage or any signs of infection.  There is no open lesions. No pain with calf compression, swelling, warmth, erythema  Assessment: Onychomycosis  Plan: -All treatment options discussed with the patient including all alternatives, risks, complications.  -Overall she has been making good improvements.  We will do 30 more days of the Lamisil but will recheck a CBC and LFT prior to starting medications.  She is not had any side effects medications we will continue to monitor. -Patient encouraged to call the office with any questions, concerns, change in symptoms.   Trula Slade DPM

## 2020-09-17 ENCOUNTER — Ambulatory Visit: Payer: Medicare HMO | Admitting: Family Medicine

## 2020-09-17 ENCOUNTER — Other Ambulatory Visit: Payer: Self-pay

## 2020-09-17 DIAGNOSIS — M778 Other enthesopathies, not elsewhere classified: Secondary | ICD-10-CM

## 2020-09-17 NOTE — Progress Notes (Signed)
  Sunni Richardson - 70 y.o. female MRN [id number]  Date of birth: 06/27/1951  SUBJECTIVE:  Including CC & ROS.  No chief complaint on file.   Ezmeralda Stefanick is a 70 y.o. female that is following up for her left shoulder.  Has been doing well with only minor intermittent pain.   Review of Systems See HPI   HISTORY: Past Medical, Surgical, Social, and Family History Reviewed & Updated per EMR.   Pertinent Historical Findings include:  Past Medical History:  Diagnosis Date  . Anxiety   . Hypothyroid   . Vitamin D deficiency    hx of    Past Surgical History:  Procedure Laterality Date  . CESAREAN SECTION     x3    Family History  Problem Relation Age of Onset  . Heart disease Father   . Heart attack Mother   . Ovarian cancer Maternal Grandmother   . Stroke Maternal Grandfather     Social History   Socioeconomic History  . Marital status: Single    Spouse name: Not on file  . Number of children: Not on file  . Years of education: Not on file  . Highest education level: Not on file  Occupational History  . Not on file  Tobacco Use  . Smoking status: Never Smoker  . Smokeless tobacco: Never Used  Vaping Use  . Vaping Use: Never used  Substance and Sexual Activity  . Alcohol use: Yes  . Drug use: Never  . Sexual activity: Yes    Birth control/protection: None    Comment: intercourse age 78.,less than 5 sexual partners,des neg  Other Topics Concern  . Not on file  Social History Narrative  . Not on file   Social Determinants of Health   Financial Resource Strain: Not on file  Food Insecurity: Not on file  Transportation Needs: Not on file  Physical Activity: Not on file  Stress: Not on file  Social Connections: Not on file  Intimate Partner Violence: Not on file     PHYSICAL EXAM:  VS: BP 102/62 (BP Location: Left Arm, Patient Position: Sitting, Cuff Size: Normal)   Ht 5\' 6"  (1.676 m)   Wt 145 lb (65.8 kg)   BMI 23.40 kg/m  Physical Exam Gen: NAD, alert,  cooperative with exam, well-appearing MSK:  Left shoulder: Normal range of motion. Normal strength resistance. Neurovascular intact   ASSESSMENT & PLAN:   Capsulitis of left shoulder Significant improvement with her pain and function status post the injection. -Counseled on home exercise therapy and supportive care. -Could consider further imaging if needed.

## 2020-09-17 NOTE — Assessment & Plan Note (Signed)
Significant improvement with her pain and function status post the injection. -Counseled on home exercise therapy and supportive care. -Could consider further imaging if needed.

## 2020-09-26 ENCOUNTER — Encounter: Payer: Self-pay | Admitting: Podiatry

## 2020-09-30 DIAGNOSIS — H1013 Acute atopic conjunctivitis, bilateral: Secondary | ICD-10-CM | POA: Diagnosis not present

## 2020-09-30 DIAGNOSIS — H04123 Dry eye syndrome of bilateral lacrimal glands: Secondary | ICD-10-CM | POA: Diagnosis not present

## 2020-10-30 DIAGNOSIS — H25813 Combined forms of age-related cataract, bilateral: Secondary | ICD-10-CM | POA: Diagnosis not present

## 2020-11-08 DIAGNOSIS — L299 Pruritus, unspecified: Secondary | ICD-10-CM | POA: Diagnosis not present

## 2020-11-08 DIAGNOSIS — L237 Allergic contact dermatitis due to plants, except food: Secondary | ICD-10-CM | POA: Diagnosis not present

## 2020-12-04 DIAGNOSIS — H25043 Posterior subcapsular polar age-related cataract, bilateral: Secondary | ICD-10-CM | POA: Diagnosis not present

## 2020-12-04 DIAGNOSIS — H2513 Age-related nuclear cataract, bilateral: Secondary | ICD-10-CM | POA: Diagnosis not present

## 2020-12-04 DIAGNOSIS — H25013 Cortical age-related cataract, bilateral: Secondary | ICD-10-CM | POA: Diagnosis not present

## 2020-12-04 DIAGNOSIS — H18413 Arcus senilis, bilateral: Secondary | ICD-10-CM | POA: Diagnosis not present

## 2020-12-04 DIAGNOSIS — H2512 Age-related nuclear cataract, left eye: Secondary | ICD-10-CM | POA: Diagnosis not present

## 2020-12-26 DIAGNOSIS — E039 Hypothyroidism, unspecified: Secondary | ICD-10-CM | POA: Diagnosis not present

## 2021-01-19 DIAGNOSIS — Z20822 Contact with and (suspected) exposure to covid-19: Secondary | ICD-10-CM | POA: Diagnosis not present

## 2021-01-19 DIAGNOSIS — U071 COVID-19: Secondary | ICD-10-CM | POA: Diagnosis not present

## 2021-02-04 DIAGNOSIS — H2512 Age-related nuclear cataract, left eye: Secondary | ICD-10-CM | POA: Diagnosis not present

## 2021-02-05 DIAGNOSIS — H2511 Age-related nuclear cataract, right eye: Secondary | ICD-10-CM | POA: Diagnosis not present

## 2021-02-25 DIAGNOSIS — H2511 Age-related nuclear cataract, right eye: Secondary | ICD-10-CM | POA: Diagnosis not present

## 2021-03-05 DIAGNOSIS — X32XXXD Exposure to sunlight, subsequent encounter: Secondary | ICD-10-CM | POA: Diagnosis not present

## 2021-03-05 DIAGNOSIS — L570 Actinic keratosis: Secondary | ICD-10-CM | POA: Diagnosis not present

## 2021-03-05 DIAGNOSIS — L821 Other seborrheic keratosis: Secondary | ICD-10-CM | POA: Diagnosis not present

## 2021-03-15 DIAGNOSIS — M85851 Other specified disorders of bone density and structure, right thigh: Secondary | ICD-10-CM | POA: Diagnosis not present

## 2021-03-15 DIAGNOSIS — M85852 Other specified disorders of bone density and structure, left thigh: Secondary | ICD-10-CM | POA: Diagnosis not present

## 2021-03-15 DIAGNOSIS — Z1231 Encounter for screening mammogram for malignant neoplasm of breast: Secondary | ICD-10-CM | POA: Diagnosis not present

## 2021-03-19 ENCOUNTER — Encounter: Payer: Self-pay | Admitting: Obstetrics & Gynecology

## 2021-05-10 ENCOUNTER — Encounter: Payer: Self-pay | Admitting: Family Medicine

## 2021-05-10 ENCOUNTER — Ambulatory Visit: Payer: Medicare HMO | Admitting: Family Medicine

## 2021-05-10 VITALS — BP 120/70 | Ht 66.0 in | Wt 145.0 lb

## 2021-05-10 DIAGNOSIS — M778 Other enthesopathies, not elsewhere classified: Secondary | ICD-10-CM | POA: Diagnosis not present

## 2021-05-10 MED ORDER — PREDNISONE 5 MG PO TABS: ORAL_TABLET | ORAL | 0 refills | Status: DC

## 2021-05-10 NOTE — Assessment & Plan Note (Signed)
Acute on chronic in nature.  Symptoms seem most consistent with a capsule.  Not a frozen shoulder -Counseled on home exercise therapy and supportive care. -Prednisone. -Could consider injection or further imaging.

## 2021-05-10 NOTE — Progress Notes (Signed)
  Michaela Elliott - 70 y.o. female MRN [id number]  Date of birth: 1950/11/25  SUBJECTIVE:  Including CC & ROS.  No chief complaint on file.   Michaela Elliott is a 70 y.o. female that is presenting with acute worsening of her left shoulder pain.  No injury or fall.  Pain is occurring over the joint itself.    Review of Systems See HPI   HISTORY: Past Medical, Surgical, Social, and Family History Reviewed & Updated per EMR.   Pertinent Historical Findings include:  Past Medical History:  Diagnosis Date   Anxiety    Hypothyroid    Vitamin D deficiency    hx of    Past Surgical History:  Procedure Laterality Date   CESAREAN SECTION     x3    Family History  Problem Relation Age of Onset   Heart disease Father    Heart attack Mother    Ovarian cancer Maternal Grandmother    Stroke Maternal Grandfather     Social History   Socioeconomic History   Marital status: Single    Spouse name: Not on file   Number of children: Not on file   Years of education: Not on file   Highest education level: Not on file  Occupational History   Not on file  Tobacco Use   Smoking status: Never   Smokeless tobacco: Never  Vaping Use   Vaping Use: Never used  Substance and Sexual Activity   Alcohol use: Yes   Drug use: Never   Sexual activity: Yes    Birth control/protection: None    Comment: intercourse age 19.,less than 5 sexual partners,des neg  Other Topics Concern   Not on file  Social History Narrative   Not on file   Social Determinants of Health   Financial Resource Strain: Not on file  Food Insecurity: Not on file  Transportation Needs: Not on file  Physical Activity: Not on file  Stress: Not on file  Social Connections: Not on file  Intimate Partner Violence: Not on file     PHYSICAL EXAM:  VS: BP 120/70 (BP Location: Right Arm, Patient Position: Sitting)   Ht 5\' 6"  (1.676 m)   Wt 145 lb (65.8 kg)   BMI 23.40 kg/m  Physical Exam Gen: NAD, alert, cooperative with  exam, well-appearing      ASSESSMENT & PLAN:   Capsulitis of left shoulder Acute on chronic in nature.  Symptoms seem most consistent with a capsule.  Not a frozen shoulder -Counseled on home exercise therapy and supportive care. -Prednisone. -Could consider injection or further imaging.

## 2021-05-22 DIAGNOSIS — L570 Actinic keratosis: Secondary | ICD-10-CM | POA: Diagnosis not present

## 2021-05-22 DIAGNOSIS — X32XXXD Exposure to sunlight, subsequent encounter: Secondary | ICD-10-CM | POA: Diagnosis not present

## 2021-07-29 ENCOUNTER — Other Ambulatory Visit (HOSPITAL_COMMUNITY)
Admission: RE | Admit: 2021-07-29 | Discharge: 2021-07-29 | Disposition: A | Discharge status: Home / Self Care | Payer: Medicare HMO | Source: Ambulatory Visit | Attending: Obstetrics & Gynecology | Admitting: Obstetrics & Gynecology

## 2021-07-29 ENCOUNTER — Other Ambulatory Visit: Payer: Self-pay

## 2021-07-29 ENCOUNTER — Encounter: Payer: Self-pay | Admitting: Obstetrics & Gynecology

## 2021-07-29 ENCOUNTER — Ambulatory Visit (INDEPENDENT_AMBULATORY_CARE_PROVIDER_SITE_OTHER): Payer: Medicare HMO | Admitting: Obstetrics & Gynecology

## 2021-07-29 VITALS — BP 136/80 | HR 53 | Ht 66.5 in | Wt 155.0 lb

## 2021-07-29 DIAGNOSIS — Z9189 Other specified personal risk factors, not elsewhere classified: Secondary | ICD-10-CM

## 2021-07-29 DIAGNOSIS — Z01419 Encounter for gynecological examination (general) (routine) without abnormal findings: Secondary | ICD-10-CM | POA: Diagnosis not present

## 2021-07-29 DIAGNOSIS — M8580 Other specified disorders of bone density and structure, unspecified site: Secondary | ICD-10-CM

## 2021-07-29 DIAGNOSIS — M810 Age-related osteoporosis without current pathological fracture: Secondary | ICD-10-CM

## 2021-07-29 DIAGNOSIS — Z780 Asymptomatic menopausal state: Secondary | ICD-10-CM

## 2021-07-29 NOTE — Progress Notes (Signed)
Michaela Elliott December 25, 1950 [id number]   History:    71 y.o. G3P3L3  Boyfriend x 4 yrs.  Retired.   RP:  Established patient presenting for annual gyn exam    HPI: Postmenopause, well on no HRT.  No PMB.  No pelvic pain.  Rarely sexually active.  Pap Neg 01/2019. Urine/BMs normal.  Breasts normal.  Mammo Neg 02/2021.  BMI 24.64.  Very fit, doing palatis and tennis.  BD 02/2021 Osteopenia T-Score -2.4.  Vit D, Ca++.  Healthy nutrition. Hypothyroidism well controled now, followed by Dr Chalmers Cater.  Health labs with Dr Chalmers Cater.  Colonoscopy 2014.  Past medical history,surgical history, family history and social history were all reviewed and documented in the EPIC chart.  Gynecologic History No LMP recorded. Patient is postmenopausal.  Obstetric History OB History  Gravida Para Term Preterm AB Living  3 3     0 3  SAB IAB Ectopic Multiple Live Births      0        # Outcome Date GA Lbr Len/2nd Weight Sex Delivery Anes PTL Lv  3 Para           2 Para           1 Para              ROS: A ROS was performed and pertinent positives and negatives are included in the history.  GENERAL: No fevers or chills. HEENT: No change in vision, no earache, sore throat or sinus congestion. NECK: No pain or stiffness. CARDIOVASCULAR: No chest pain or pressure. No palpitations. PULMONARY: No shortness of breath, cough or wheeze. GASTROINTESTINAL: No abdominal pain, nausea, vomiting or diarrhea, melena or bright red blood per rectum. GENITOURINARY: No urinary frequency, urgency, hesitancy or dysuria. MUSCULOSKELETAL: No joint or muscle pain, no back pain, no recent trauma. DERMATOLOGIC: No rash, no itching, no lesions. ENDOCRINE: No polyuria, polydipsia, no heat or cold intolerance. No recent change in weight. HEMATOLOGICAL: No anemia or easy bruising or bleeding. NEUROLOGIC: No headache, seizures, numbness, tingling or weakness. PSYCHIATRIC: No depression, no loss of interest in normal activity or change in sleep pattern.      Exam:   BP 136/80    Pulse (!) 53    Ht 5' 6.5" (1.689 m)    Wt 155 lb (70.3 kg)    SpO2 99%    BMI 24.64 kg/m   Body mass index is 24.64 kg/m.  General appearance : Well developed well nourished female. No acute distress HEENT: Eyes: no retinal hemorrhage or exudates,  Neck supple, trachea midline, no carotid bruits, no thyroidmegaly Lungs: Clear to auscultation, no rhonchi or wheezes, or rib retractions  Heart: Regular rate and rhythm, no murmurs or gallops Breast:Examined in sitting and supine position were symmetrical in appearance, no palpable masses or tenderness,  no skin retraction, no nipple inversion, no nipple discharge, no skin discoloration, no axillary or supraclavicular lymphadenopathy Abdomen: no palpable masses or tenderness, no rebound or guarding Extremities: no edema or skin discoloration or tenderness  Pelvic: Vulva: Normal             Vagina: No gross lesions or discharge  Cervix: No gross lesions or discharge.  Pap reflex done.  Uterus  AV, normal size, shape and consistency, non-tender and mobile  Adnexa  Without masses or tenderness  Anus: Normal   Assessment/Plan:  71 y.o. female for annual exam   1. Encounter for routine gynecological examination with Papanicolaou smear of cervix Postmenopause, well  on no HRT.  No PMB.  No pelvic pain.  Rarely sexually active.  Pap Neg 01/2019. Urine/BMs normal.  Breasts normal.  Mammo Neg 02/2021.  BMI 24.64.  Very fit, doing palatis and tennis.  BD 02/2021 Osteopenia T-Score -2.4.  Vit D, Ca++.  Healthy nutrition. Hypothyroidism well controled now, followed by Dr Chalmers Cater.  Health labs with Dr Chalmers Cater.  Colonoscopy 2014.= - Cytology - PAP( [place])  2. At risk of fracture due to osteopenia  3. Postmenopause Postmenopause, well on no HRT.  No PMB.  No pelvic pain.  Rarely sexually active.   4. Osteopenia, unspecified location   BD 02/2021 Osteopenia T-Score -2.4.  Vit D, Ca++.  Very fit, doing palatis and tennis.    Princess Bruins MD, 11:26 AM 07/29/2021

## 2021-07-30 LAB — CYTOLOGY - PAP: Diagnosis: NEGATIVE

## 2021-08-23 ENCOUNTER — Ambulatory Visit: Payer: Medicare HMO | Admitting: Obstetrics & Gynecology

## 2021-09-10 DIAGNOSIS — C44519 Basal cell carcinoma of skin of other part of trunk: Secondary | ICD-10-CM | POA: Diagnosis not present

## 2021-09-10 DIAGNOSIS — X32XXXD Exposure to sunlight, subsequent encounter: Secondary | ICD-10-CM | POA: Diagnosis not present

## 2021-09-10 DIAGNOSIS — L570 Actinic keratosis: Secondary | ICD-10-CM | POA: Diagnosis not present

## 2021-09-30 DIAGNOSIS — Z0100 Encounter for examination of eyes and vision without abnormal findings: Secondary | ICD-10-CM | POA: Diagnosis not present

## 2021-10-29 ENCOUNTER — Ambulatory Visit: Payer: Medicare HMO | Admitting: Family Medicine

## 2021-10-29 ENCOUNTER — Encounter: Payer: Self-pay | Admitting: Family Medicine

## 2021-10-29 VITALS — BP 114/62 | Ht 66.5 in | Wt 155.0 lb

## 2021-10-29 DIAGNOSIS — M778 Other enthesopathies, not elsewhere classified: Secondary | ICD-10-CM | POA: Diagnosis not present

## 2021-10-29 NOTE — Progress Notes (Signed)
?  Michaela Elliott - 71 y.o. female MRN [id number]  Date of birth: Mar 04, 1951 ? ?SUBJECTIVE:  Including CC & ROS.  ?No chief complaint on file. ? ? ?Michaela Elliott is a 71 y.o. female that is presenting with acute on chronic left shoulder pain.  The pain has been ongoing for about 18 months.  She has tried home exercise therapy and injections.  She is still having pain with internal rotation.  She has limited external rotation.  The pain occurred after a fall in January 2021. ? ? ? ?Review of Systems ?See HPI  ? ?HISTORY: Past Medical, Surgical, Social, and Family History Reviewed & Updated per EMR.   ?Pertinent Historical Findings include: ? ?Past Medical History:  ?Diagnosis Date  ? Anxiety   ? Hypothyroid   ? Vitamin D deficiency   ? hx of  ? ? ?Past Surgical History:  ?Procedure Laterality Date  ? CESAREAN SECTION    ? x3  ? ? ? ?PHYSICAL EXAM:  ?VS: BP 114/62 (BP Location: Left Arm, Patient Position: Sitting)   Ht 5' 6.5" (1.689 m)   Wt 155 lb (70.3 kg)   BMI 24.64 kg/m?  ?Physical Exam ?Gen: NAD, alert, cooperative with exam, well-appearing ?MSK:  ?Left shoulder: ?Limited internal and external rotation. ?Limited abduction. ?Positive empty can test. ?Positive O'Brien's test ?Neurovascularly intact   ? ? ? ? ?ASSESSMENT & PLAN:  ? ?Capsulitis of left shoulder ?Acute on chronic.  Patient has been ongoing for several months now.  We have tried injections and greater than over 6 weeks of home exercise therapy.  She still has pain around the shoulder joint.  X-rays have been nonconclusive. ?-Counseled on home exercise therapy and supportive care. ?-MRI of the left shoulder to evaluate for labral tear and for presurgical planning. ? ? ? ? ?

## 2021-10-29 NOTE — Assessment & Plan Note (Signed)
Acute on chronic.  Patient has been ongoing for several months now.  We have tried injections and greater than over 6 weeks of home exercise therapy.  She still has pain around the shoulder joint.  X-rays have been nonconclusive. ?-Counseled on home exercise therapy and supportive care. ?-MRI of the left shoulder to evaluate for labral tear and for presurgical planning. ?

## 2021-10-29 NOTE — Patient Instructions (Signed)
Good to see you ?We'll proceed with the MRI at [place]   ?Please send me a message in MyChart with any questions or updates.  ?We'll setup a virtual visit once the MRI is resulted.  ? ?--Dr. Raeford Razor ? ?

## 2021-11-09 ENCOUNTER — Ambulatory Visit
Admission: RE | Admit: 2021-11-09 | Discharge: 2021-11-09 | Disposition: A | Discharge status: Home / Self Care | Payer: Medicare HMO | Source: Ambulatory Visit | Attending: Family Medicine | Admitting: Family Medicine

## 2021-11-09 ENCOUNTER — Other Ambulatory Visit: Payer: Medicare HMO

## 2021-11-09 DIAGNOSIS — M75102 Unspecified rotator cuff tear or rupture of left shoulder, not specified as traumatic: Secondary | ICD-10-CM | POA: Diagnosis not present

## 2021-11-09 DIAGNOSIS — M778 Other enthesopathies, not elsewhere classified: Secondary | ICD-10-CM

## 2021-11-09 DIAGNOSIS — M25412 Effusion, left shoulder: Secondary | ICD-10-CM | POA: Diagnosis not present

## 2021-11-11 ENCOUNTER — Telehealth: Payer: Self-pay | Admitting: Family Medicine

## 2021-11-11 NOTE — Telephone Encounter (Signed)
Patient left msg to call office to schedule Virtual appt for MRI results. ? ?--glh ?

## 2021-11-12 ENCOUNTER — Telehealth (INDEPENDENT_AMBULATORY_CARE_PROVIDER_SITE_OTHER): Payer: Medicare HMO | Admitting: Family Medicine

## 2021-11-12 DIAGNOSIS — M19012 Primary osteoarthritis, left shoulder: Secondary | ICD-10-CM

## 2021-11-12 NOTE — Progress Notes (Signed)
Virtual Visit via Video Note ? ?I connected with Michaela Elliott on 11/12/21 at  1:10 PM EDT by a video enabled telemedicine application and verified that I am speaking with the correct person using two identifiers. ? ?Location: ?Patient: home ?Provider: office ?  ?I discussed the limitations of evaluation and management by telemedicine and the availability of in person appointments. The patient expressed understanding and agreed to proceed. ? ?History of Present Illness: ? ?Ms. Michaela Elliott is a 71 year old female that is following up after the MRI of the left shoulder.  This was demonstrating severe tendinosis of the intra-articular portion of the long head of the bicep with a partial-thickness cartilage loss of the glenohumeral joint with a small interstitial tear of the peripheral supraspinatus. ? ?Observations/Objective: ? ? ?Assessment and Plan: ? ?Osteoarthritis of left shoulder: ?MRI was showing partial-thickness cartilage loss as well as a small effusion with severe tendinosis of the intra-articular portion of the long head of the biceps.  Her pain has been ongoing for about 20 months now.  We have tried different injections as well as physical therapy and she is still having limited range of motion. ?-Counseled on home exercise therapy and supportive care. ?-Referral to orthopedics. ? ?Follow Up Instructions: ? ?  ?I discussed the assessment and treatment plan with the patient. The patient was provided an opportunity to ask questions and all were answered. The patient agreed with the plan and demonstrated an understanding of the instructions. ?  ?The patient was advised to call back or seek an in-person evaluation if the symptoms worsen or if the condition fails to improve as anticipated. ? ? ? ?Clearance Coots, MD ? ? ?

## 2021-11-12 NOTE — Assessment & Plan Note (Signed)
MRI was showing partial-thickness cartilage loss as well as a small effusion with severe tendinosis of the intra-articular portion of the long head of the biceps.  Her pain has been ongoing for about 20 months now.  We have tried different injections as well as physical therapy and she is still having limited range of motion. ?-Counseled on home exercise therapy and supportive care. ?-Referral to orthopedics. ? ?

## 2021-11-14 DIAGNOSIS — H16223 Keratoconjunctivitis sicca, not specified as Sjogren's, bilateral: Secondary | ICD-10-CM | POA: Diagnosis not present

## 2021-11-18 DIAGNOSIS — E039 Hypothyroidism, unspecified: Secondary | ICD-10-CM | POA: Diagnosis not present

## 2021-11-20 DIAGNOSIS — M25512 Pain in left shoulder: Secondary | ICD-10-CM | POA: Diagnosis not present

## 2021-11-26 ENCOUNTER — Encounter: Payer: Self-pay | Admitting: Family Medicine

## 2021-11-27 DIAGNOSIS — S46112D Strain of muscle, fascia and tendon of long head of biceps, left arm, subsequent encounter: Secondary | ICD-10-CM | POA: Diagnosis not present

## 2021-11-27 DIAGNOSIS — M6281 Muscle weakness (generalized): Secondary | ICD-10-CM | POA: Diagnosis not present

## 2021-11-27 DIAGNOSIS — M25612 Stiffness of left shoulder, not elsewhere classified: Secondary | ICD-10-CM | POA: Diagnosis not present

## 2021-12-02 DIAGNOSIS — S46112D Strain of muscle, fascia and tendon of long head of biceps, left arm, subsequent encounter: Secondary | ICD-10-CM | POA: Diagnosis not present

## 2021-12-02 DIAGNOSIS — M25612 Stiffness of left shoulder, not elsewhere classified: Secondary | ICD-10-CM | POA: Diagnosis not present

## 2021-12-02 DIAGNOSIS — M6281 Muscle weakness (generalized): Secondary | ICD-10-CM | POA: Diagnosis not present

## 2021-12-03 DIAGNOSIS — M7502 Adhesive capsulitis of left shoulder: Secondary | ICD-10-CM | POA: Diagnosis not present

## 2021-12-03 DIAGNOSIS — M25512 Pain in left shoulder: Secondary | ICD-10-CM | POA: Diagnosis not present

## 2021-12-10 DIAGNOSIS — S46112D Strain of muscle, fascia and tendon of long head of biceps, left arm, subsequent encounter: Secondary | ICD-10-CM | POA: Diagnosis not present

## 2021-12-10 DIAGNOSIS — M6281 Muscle weakness (generalized): Secondary | ICD-10-CM | POA: Diagnosis not present

## 2021-12-10 DIAGNOSIS — M25612 Stiffness of left shoulder, not elsewhere classified: Secondary | ICD-10-CM | POA: Diagnosis not present

## 2021-12-12 DIAGNOSIS — M25612 Stiffness of left shoulder, not elsewhere classified: Secondary | ICD-10-CM | POA: Diagnosis not present

## 2021-12-12 DIAGNOSIS — S46112D Strain of muscle, fascia and tendon of long head of biceps, left arm, subsequent encounter: Secondary | ICD-10-CM | POA: Diagnosis not present

## 2021-12-12 DIAGNOSIS — M6281 Muscle weakness (generalized): Secondary | ICD-10-CM | POA: Diagnosis not present

## 2021-12-18 DIAGNOSIS — M6281 Muscle weakness (generalized): Secondary | ICD-10-CM | POA: Diagnosis not present

## 2021-12-18 DIAGNOSIS — S46112D Strain of muscle, fascia and tendon of long head of biceps, left arm, subsequent encounter: Secondary | ICD-10-CM | POA: Diagnosis not present

## 2021-12-18 DIAGNOSIS — M25612 Stiffness of left shoulder, not elsewhere classified: Secondary | ICD-10-CM | POA: Diagnosis not present

## 2021-12-25 DIAGNOSIS — M6281 Muscle weakness (generalized): Secondary | ICD-10-CM | POA: Diagnosis not present

## 2021-12-25 DIAGNOSIS — S46112D Strain of muscle, fascia and tendon of long head of biceps, left arm, subsequent encounter: Secondary | ICD-10-CM | POA: Diagnosis not present

## 2021-12-25 DIAGNOSIS — M25612 Stiffness of left shoulder, not elsewhere classified: Secondary | ICD-10-CM | POA: Diagnosis not present

## 2022-01-01 DIAGNOSIS — M6281 Muscle weakness (generalized): Secondary | ICD-10-CM | POA: Diagnosis not present

## 2022-01-01 DIAGNOSIS — M25612 Stiffness of left shoulder, not elsewhere classified: Secondary | ICD-10-CM | POA: Diagnosis not present

## 2022-01-01 DIAGNOSIS — S46112D Strain of muscle, fascia and tendon of long head of biceps, left arm, subsequent encounter: Secondary | ICD-10-CM | POA: Diagnosis not present

## 2022-01-03 DIAGNOSIS — M25612 Stiffness of left shoulder, not elsewhere classified: Secondary | ICD-10-CM | POA: Diagnosis not present

## 2022-01-03 DIAGNOSIS — S46112D Strain of muscle, fascia and tendon of long head of biceps, left arm, subsequent encounter: Secondary | ICD-10-CM | POA: Diagnosis not present

## 2022-01-03 DIAGNOSIS — M6281 Muscle weakness (generalized): Secondary | ICD-10-CM | POA: Diagnosis not present

## 2022-01-06 DIAGNOSIS — M6281 Muscle weakness (generalized): Secondary | ICD-10-CM | POA: Diagnosis not present

## 2022-01-06 DIAGNOSIS — S46112D Strain of muscle, fascia and tendon of long head of biceps, left arm, subsequent encounter: Secondary | ICD-10-CM | POA: Diagnosis not present

## 2022-01-06 DIAGNOSIS — M25612 Stiffness of left shoulder, not elsewhere classified: Secondary | ICD-10-CM | POA: Diagnosis not present

## 2022-01-08 DIAGNOSIS — M25612 Stiffness of left shoulder, not elsewhere classified: Secondary | ICD-10-CM | POA: Diagnosis not present

## 2022-01-08 DIAGNOSIS — M6281 Muscle weakness (generalized): Secondary | ICD-10-CM | POA: Diagnosis not present

## 2022-01-08 DIAGNOSIS — S46112D Strain of muscle, fascia and tendon of long head of biceps, left arm, subsequent encounter: Secondary | ICD-10-CM | POA: Diagnosis not present

## 2022-01-16 DIAGNOSIS — S46112D Strain of muscle, fascia and tendon of long head of biceps, left arm, subsequent encounter: Secondary | ICD-10-CM | POA: Diagnosis not present

## 2022-01-16 DIAGNOSIS — M25612 Stiffness of left shoulder, not elsewhere classified: Secondary | ICD-10-CM | POA: Diagnosis not present

## 2022-01-16 DIAGNOSIS — M6281 Muscle weakness (generalized): Secondary | ICD-10-CM | POA: Diagnosis not present

## 2022-01-28 DIAGNOSIS — M6281 Muscle weakness (generalized): Secondary | ICD-10-CM | POA: Diagnosis not present

## 2022-01-28 DIAGNOSIS — M25612 Stiffness of left shoulder, not elsewhere classified: Secondary | ICD-10-CM | POA: Diagnosis not present

## 2022-01-28 DIAGNOSIS — S46112D Strain of muscle, fascia and tendon of long head of biceps, left arm, subsequent encounter: Secondary | ICD-10-CM | POA: Diagnosis not present

## 2022-02-13 DIAGNOSIS — M25612 Stiffness of left shoulder, not elsewhere classified: Secondary | ICD-10-CM | POA: Diagnosis not present

## 2022-02-13 DIAGNOSIS — S46112D Strain of muscle, fascia and tendon of long head of biceps, left arm, subsequent encounter: Secondary | ICD-10-CM | POA: Diagnosis not present

## 2022-02-13 DIAGNOSIS — M6281 Muscle weakness (generalized): Secondary | ICD-10-CM | POA: Diagnosis not present

## 2022-02-20 DIAGNOSIS — S46112D Strain of muscle, fascia and tendon of long head of biceps, left arm, subsequent encounter: Secondary | ICD-10-CM | POA: Diagnosis not present

## 2022-02-20 DIAGNOSIS — M25612 Stiffness of left shoulder, not elsewhere classified: Secondary | ICD-10-CM | POA: Diagnosis not present

## 2022-02-20 DIAGNOSIS — M6281 Muscle weakness (generalized): Secondary | ICD-10-CM | POA: Diagnosis not present

## 2022-02-27 DIAGNOSIS — M6281 Muscle weakness (generalized): Secondary | ICD-10-CM | POA: Diagnosis not present

## 2022-02-27 DIAGNOSIS — M25612 Stiffness of left shoulder, not elsewhere classified: Secondary | ICD-10-CM | POA: Diagnosis not present

## 2022-02-27 DIAGNOSIS — S46112D Strain of muscle, fascia and tendon of long head of biceps, left arm, subsequent encounter: Secondary | ICD-10-CM | POA: Diagnosis not present

## 2022-03-06 DIAGNOSIS — M25612 Stiffness of left shoulder, not elsewhere classified: Secondary | ICD-10-CM | POA: Diagnosis not present

## 2022-03-06 DIAGNOSIS — S46112D Strain of muscle, fascia and tendon of long head of biceps, left arm, subsequent encounter: Secondary | ICD-10-CM | POA: Diagnosis not present

## 2022-03-06 DIAGNOSIS — M6281 Muscle weakness (generalized): Secondary | ICD-10-CM | POA: Diagnosis not present

## 2022-03-13 DIAGNOSIS — M6281 Muscle weakness (generalized): Secondary | ICD-10-CM | POA: Diagnosis not present

## 2022-03-13 DIAGNOSIS — S46112D Strain of muscle, fascia and tendon of long head of biceps, left arm, subsequent encounter: Secondary | ICD-10-CM | POA: Diagnosis not present

## 2022-03-13 DIAGNOSIS — M25612 Stiffness of left shoulder, not elsewhere classified: Secondary | ICD-10-CM | POA: Diagnosis not present

## 2022-04-04 DIAGNOSIS — Z1231 Encounter for screening mammogram for malignant neoplasm of breast: Secondary | ICD-10-CM | POA: Diagnosis not present

## 2022-04-07 ENCOUNTER — Encounter: Payer: Self-pay | Admitting: Obstetrics & Gynecology

## 2022-08-14 DIAGNOSIS — S93601A Unspecified sprain of right foot, initial encounter: Secondary | ICD-10-CM | POA: Diagnosis not present

## 2022-08-14 DIAGNOSIS — S93401A Sprain of unspecified ligament of right ankle, initial encounter: Secondary | ICD-10-CM | POA: Diagnosis not present

## 2022-09-01 ENCOUNTER — Ambulatory Visit: Payer: Medicare HMO | Admitting: Podiatry

## 2022-09-01 DIAGNOSIS — L84 Corns and callosities: Secondary | ICD-10-CM | POA: Diagnosis not present

## 2022-09-01 DIAGNOSIS — B351 Tinea unguium: Secondary | ICD-10-CM | POA: Diagnosis not present

## 2022-09-01 MED ORDER — EFINACONAZOLE 10 % EX SOLN: 1.0000 [drp] | Freq: Every day | CUTANEOUS | 11 refills | Status: DC

## 2022-09-01 NOTE — Patient Instructions (Signed)
You can also use UREA NAIL GEL on the toenails  --  Efinaconazole Topical Solution What is this medication? EFINACONAZOLE (e FEE na KON a zole) treats fungal infections of the nails. It belongs to a group of medications called antifungals. It will not treat infections caused by bacteria or viruses. This medicine may be used for other purposes; ask your health care provider or pharmacist if you have questions. COMMON BRAND NAME(S): JUBLIA What should I tell my care team before I take this medication? They need to know if you have any of these conditions: An unusual or allergic reaction to efinaconazole, other medications, foods, dyes or preservatives Pregnant or trying to get pregnant Breast-feeding How should I use this medication? This medication is for external use only. Do not take by mouth. Wash your hands before and after use. Do not get it in your eyes. If you do, rinse your eyes with plenty of cool tap water. Use it as directed on the prescription label. Do not use it more often than directed. Use the medication for the full course as directed by your care team, even if you think you are better. Do not stop using it unless your care team tells you to stop it early. This medication comes with INSTRUCTIONS FOR USE. Ask your pharmacist for directions on how to use this medication. Read the information carefully. Talk to your pharmacist or care team if you have questions. Talk to your care team about the use of this medication in children. While it may be prescribed for children as young as 6 years for selected conditions, precautions do apply. Overdosage: If you think you have taken too much of this medicine contact a poison control center or emergency room at once. NOTE: This medicine is only for you. Do not share this medicine with others. What if I miss a dose? If you miss a dose, use it as soon as you can. If it is almost time for your next dose, use only that dose. Do not use double or  extra doses. What may interact with this medication? Interactions are not expected. Do not use any other skin products on the same area of skin without talking to your care team. This list may not describe all possible interactions. Give your health care provider a list of all the medicines, herbs, non-prescription drugs, or dietary supplements you use. Also tell them if you smoke, drink alcohol, or use illegal drugs. Some items may interact with your medicine. What should I watch for while using this medication? Visit your care team for regular checks on your progress. It may be some time before you see the benefit from this medication. Do not use nail polish or other nail cosmetic products on the treated nails. What side effects may I notice from receiving this medication? Side effects that you should report to your care team as soon as possible: Allergic reactions--skin rash, itching, hives, swelling of the face, lips, tongue, or throat Side effects that usually do not require medical attention (report to your care team if they continue or are bothersome): Ingrown nails Mild skin irritation, redness, or dryness This list may not describe all possible side effects. Call your doctor for medical advice about side effects. You may report side effects to FDA at 1-800-FDA-1088. Where should I keep my medication? Keep out of the reach of children and pets. Store at room temperature between 20 and 25 degrees C (68 and 77 degrees F). Do not freeze. Keep the  container tightly closed. Get rid of any unused medication after the expiration date. This medication is flammable. Avoid exposure to heat, flame, and smoking. To get rid of medications that are no longer needed or have expired: Take the medication to a medication take-back program. Check with your pharmacy or law enforcement to find a location. If you cannot return the medication, ask your pharmacist or care team how to get rid of this medication  safely. NOTE: This sheet is a summary. It may not cover all possible information. If you have questions about this medicine, talk to your doctor, pharmacist, or health care provider.  2023 Elsevier/Gold Standard (2021-09-15 00:00:00)

## 2022-09-02 ENCOUNTER — Other Ambulatory Visit: Payer: Self-pay | Admitting: Podiatry

## 2022-09-02 MED ORDER — TAVABOROLE 5 % EX SOLN: 1.0000 | Freq: Every day | CUTANEOUS | 2 refills | Status: AC

## 2022-09-02 NOTE — Progress Notes (Signed)
Sent Tavaborole per pharmacy request due to coverage

## 2022-09-02 NOTE — Progress Notes (Signed)
Subjective: Chief Complaint  Patient presents with   Follow-up    Foot fungus, bilateral feet, great hallux, patient would also like to have her callouses removed on bilateral 5th digits    72 year old female presents the office today with concerns of ongoing fungus on her big toenails.  She been using over-the-counter medication.  She is asking the nails to be trimmed back to allow the medication to get them more.  She also gets calluses on both of her fifth toes which cause discomfort.  She is a Firefighter.  No open lesions.  No other concerns.    Objective: AAO x3, NAD DP/PT pulses palpable bilaterally, CRT less than 3 seconds Lateral hallux nails to be hypertrophic and dystrophic with mild yellow, brown discoloration the distal aspect.  There is no edema, erythema or signs of infection.  Hyperkeratotic lesions to bilateral fifth toes without any underlying ulceration drainage or signs of infection.  Adductovarus present and fifth digits. No pain with calf compression, swelling, warmth, erythema  Assessment: 72 year old female onychomycosis, hyperkeratotic lesions given digital  deformity  Plan: -All treatment options discussed with the patient including all alternatives, risks, complications.  -Sharply debrided the nails x 2 without any complications or bleeding.  I prescribed Jublia.  Also discussed urea nail gel. -Otherwise to debride the calluses by any complications or bleeding.  Offloading.  Follow-up -Patient encouraged to call the office with any questions, concerns, change in symptoms.   Trula Slade DPM

## 2022-09-19 ENCOUNTER — Other Ambulatory Visit: Payer: Self-pay | Admitting: Podiatry

## 2022-09-19 ENCOUNTER — Encounter: Payer: Self-pay | Admitting: Podiatry

## 2022-10-09 DIAGNOSIS — Z0000 Encounter for general adult medical examination without abnormal findings: Secondary | ICD-10-CM | POA: Diagnosis not present

## 2022-10-20 DIAGNOSIS — G479 Sleep disorder, unspecified: Secondary | ICD-10-CM | POA: Diagnosis not present

## 2022-10-20 DIAGNOSIS — Z6825 Body mass index (BMI) 25.0-25.9, adult: Secondary | ICD-10-CM | POA: Diagnosis not present

## 2022-10-20 DIAGNOSIS — F411 Generalized anxiety disorder: Secondary | ICD-10-CM | POA: Diagnosis not present

## 2022-10-20 DIAGNOSIS — Z136 Encounter for screening for cardiovascular disorders: Secondary | ICD-10-CM | POA: Diagnosis not present

## 2022-10-20 DIAGNOSIS — R7303 Prediabetes: Secondary | ICD-10-CM | POA: Diagnosis not present

## 2022-10-20 DIAGNOSIS — Z13220 Encounter for screening for lipoid disorders: Secondary | ICD-10-CM | POA: Diagnosis not present

## 2022-10-20 DIAGNOSIS — Z0000 Encounter for general adult medical examination without abnormal findings: Secondary | ICD-10-CM | POA: Diagnosis not present

## 2022-10-20 DIAGNOSIS — E039 Hypothyroidism, unspecified: Secondary | ICD-10-CM | POA: Diagnosis not present

## 2022-10-20 DIAGNOSIS — E559 Vitamin D deficiency, unspecified: Secondary | ICD-10-CM | POA: Diagnosis not present

## 2022-11-03 ENCOUNTER — Encounter: Payer: Self-pay | Admitting: *Deleted

## 2022-11-17 ENCOUNTER — Other Ambulatory Visit: Payer: Self-pay

## 2022-11-17 ENCOUNTER — Ambulatory Visit (INDEPENDENT_AMBULATORY_CARE_PROVIDER_SITE_OTHER): Payer: Medicare HMO | Admitting: Family Medicine

## 2022-11-17 VITALS — BP 122/60 | Ht 67.0 in | Wt 152.0 lb

## 2022-11-17 DIAGNOSIS — M7122 Synovial cyst of popliteal space [Baker], left knee: Secondary | ICD-10-CM | POA: Diagnosis not present

## 2022-11-17 DIAGNOSIS — M25562 Pain in left knee: Secondary | ICD-10-CM

## 2022-11-17 MED ORDER — MELOXICAM 15 MG PO TABS: ORAL_TABLET | ORAL | 1 refills | Status: AC

## 2022-11-17 NOTE — Patient Instructions (Signed)
Good to see you Please use ice as needed  Please try the exercises  You can continue the compression   Please send me a message in MyChart with any questions or updates.  Please see me back as needed.   --Dr. Jordan Likes

## 2022-11-17 NOTE — Progress Notes (Signed)
  Michaela Elliott - 72 y.o. female MRN [id number]  Date of birth: Jun 21, 1951  SUBJECTIVE:  Including CC & ROS.  No chief complaint on file.   Michaela Elliott is a 72 y.o. female that is presenting with acute left knee pain.  She felt the pain posteriorly when she was performing and Pilates.  No history of similar pain.  Has been getting improvement with meloxicam.  Has tried using the compression.  She only feels the pain with going lateral movements while playing tennis.    Review of Systems See HPI   HISTORY: Past Medical, Surgical, Social, and Family History Reviewed & Updated per EMR.   Pertinent Historical Findings include:  Past Medical History:  Diagnosis Date   Anxiety    Hypothyroid    Vitamin D deficiency    hx of    Past Surgical History:  Procedure Laterality Date   CESAREAN SECTION     x3     PHYSICAL EXAM:  VS: BP 122/60   Ht 5\' 7"  (1.702 m)   Wt 152 lb (68.9 kg)   BMI 23.81 kg/m  Physical Exam Gen: NAD, alert, cooperative with exam, well-appearing MSK:  Neurovascularly intact    Limited ultrasound: left knee pain:  Trace effusion suprapatellar pouch. Spurring at the quadricep superior pole of the patella as well as spurring at the inferior pole patella and patellar tendon. Mild medial joint space narrowing with degenerative changes of the medial meniscus. Normal-appearing lateral joint space. Presence of what appeared to be a Baker's cyst in the posterior compartment  Summary: Findings consistent with degenerative meniscal changes and trace Baker's cyst  Ultrasound and interpretation by Clare Gandy, MD    ASSESSMENT & PLAN:   Baker's cyst of knee, left Acutely occurring.  Appears to have irritated the meniscus with developed Baker's cyst.  Appears to be resolving based on the scan today. -Counseled on home exercise therapy and supportive care. -Meloxicam. -Counseled on compression. - Could consider physical therapy or injection

## 2022-11-17 NOTE — Assessment & Plan Note (Signed)
Acutely occurring.  Appears to have irritated the meniscus with developed Baker's cyst.  Appears to be resolving based on the scan today. -Counseled on home exercise therapy and supportive care. -Meloxicam. -Counseled on compression. - Could consider physical therapy or injection

## 2022-11-19 DIAGNOSIS — E039 Hypothyroidism, unspecified: Secondary | ICD-10-CM | POA: Diagnosis not present

## 2022-11-26 DIAGNOSIS — E039 Hypothyroidism, unspecified: Secondary | ICD-10-CM | POA: Diagnosis not present

## 2022-12-10 DIAGNOSIS — H04123 Dry eye syndrome of bilateral lacrimal glands: Secondary | ICD-10-CM | POA: Diagnosis not present

## 2022-12-19 ENCOUNTER — Ambulatory Visit (INDEPENDENT_AMBULATORY_CARE_PROVIDER_SITE_OTHER): Payer: Medicare HMO | Admitting: Obstetrics & Gynecology

## 2022-12-19 ENCOUNTER — Encounter: Payer: Self-pay | Admitting: Obstetrics & Gynecology

## 2022-12-19 VITALS — BP 118/74 | HR 61

## 2022-12-19 DIAGNOSIS — R21 Rash and other nonspecific skin eruption: Secondary | ICD-10-CM

## 2022-12-19 LAB — WET PREP FOR TRICH, YEAST, CLUE

## 2022-12-19 MED ORDER — TRIAMCINOLONE ACETONIDE 0.5 % EX CREA: 1.0000 | TOPICAL_CREAM | Freq: Two times a day (BID) | CUTANEOUS | 4 refills | Status: AC

## 2022-12-19 NOTE — Progress Notes (Signed)
    Michaela Elliott 12/02/1950 [id number]        72 y.o.  G3P3003   RP: Vulvar rash coming and going  HPI: Vulvar rash coming and going.  No change in soap/clothing.  Plays tennis.  Postmenopause, on no HRT. No PMB. Not using any vaginal product, no Estrogen/DHEA/Testo/Moisturizer.  No UTI Sx. BMs normal.  Abstinent.   OB History  Gravida Para Term Preterm AB Living  3 3 3    0 3  SAB IAB Ectopic Multiple Live Births      0        # Outcome Date GA Lbr Len/2nd Weight Sex Delivery Anes PTL Lv  3 Term           2 Term           1 Term             Past medical history,surgical history, problem list, medications, allergies, family history and social history were all reviewed and documented in the EPIC chart.   Directed ROS with pertinent positives and negatives documented in the history of present illness/assessment and plan.  Exam:  Vitals:   12/19/22 1328  BP: 118/74  Pulse: 61  SpO2: 97%   General appearance:  Normal  Gynecologic exam: Vulva:  White atrophy with fissures at bilateral anterior vulva and at the perineum.  No vaginal discharge.  Wet prep done.  Wet prep Negative   Assessment/Plan:  72 y.o. [name]   1. Vulvar rash Vulvar rash coming and going.  No change in soap/clothing.  Plays tennis.  Postmenopause, on no HRT. No PMB. Not using any vaginal product, no Estrogen/DHEA/Testo/Moisturizer.  No UTI Sx. BMs normal.  Abstinent. Gyn exam: Vulva with white atrophy with fissures at bilateral anterior vulva and at the perineum.  No vaginal discharge.  Wet prep done, negative.  Probable early Lichen Sclerosus.  Counseling on the condition and management.  Will try Triamcinolone cream 0.5% thin application on affected vulva BID.  F/U in 2 weeks to reassess.  Replens as needed for dryness.  Will also do the Annual Gyn exam at next visit. - WET PREP FOR TRICH, YEAST, CLUE  Other orders - LOTEMAX SM 0.38 % GEL; Apply to eye. - triamcinolone cream (KENALOG) 0.5 %; Apply 1  Application topically 2 (two) times daily.   Michaela Del MD, 1:30 PM 12/19/2022

## 2022-12-25 DIAGNOSIS — H16223 Keratoconjunctivitis sicca, not specified as Sjogren's, bilateral: Secondary | ICD-10-CM | POA: Diagnosis not present

## 2022-12-25 DIAGNOSIS — H04123 Dry eye syndrome of bilateral lacrimal glands: Secondary | ICD-10-CM | POA: Diagnosis not present

## 2023-01-01 ENCOUNTER — Encounter: Payer: Self-pay | Admitting: Obstetrics & Gynecology

## 2023-01-01 ENCOUNTER — Ambulatory Visit (INDEPENDENT_AMBULATORY_CARE_PROVIDER_SITE_OTHER): Payer: Medicare HMO | Admitting: Obstetrics & Gynecology

## 2023-01-01 VITALS — BP 114/70 | HR 54 | Ht 65.75 in | Wt 157.0 lb

## 2023-01-01 DIAGNOSIS — Z01419 Encounter for gynecological examination (general) (routine) without abnormal findings: Secondary | ICD-10-CM

## 2023-01-01 DIAGNOSIS — Z780 Asymptomatic menopausal state: Secondary | ICD-10-CM

## 2023-01-01 DIAGNOSIS — M8580 Other specified disorders of bone density and structure, unspecified site: Secondary | ICD-10-CM

## 2023-01-01 NOTE — Progress Notes (Signed)
Michaela Elliott 10-09-1950 [id number]   History:    72 y.o. G3P3L3 Retired.   RP:  Established patient presenting for annual gyn exam    HPI: Postmenopause, well on no HRT.  No PMB.  No pelvic pain.  Rarely sexually active.  Pap Neg 07/2021.  Repeat Pap at 3 years. Urine/BMs normal. Breasts normal. Mammo Neg 03/2022.  BMI 25.53. Very fit, doing palates and tennis.  BD at [facility] 02/2021 Osteopenia T-Score -2.4. Repeat BD 02/2023. Vit D, Ca++.  Healthy nutrition. Hypothyroidism well controled now, followed by Dr Talmage Nap.  Health labs with Dr Talmage Nap.  Colonoscopy 2014, repeat this year.                                          Past medical history,surgical history, family history and social history were all reviewed and documented in the EPIC chart.  Gynecologic History No LMP recorded. Patient is postmenopausal.  Obstetric History OB History  Gravida Para Term Preterm AB Living  3 3 3    0 3  SAB IAB Ectopic Multiple Live Births      0        # Outcome Date GA Lbr Len/2nd Weight Sex Delivery Anes PTL Lv  3 Term           2 Term           1 Term              ROS: A ROS was performed and pertinent positives and negatives are included in the history. GENERAL: No fevers or chills. HEENT: No change in vision, no earache, sore throat or sinus congestion. NECK: No pain or stiffness. CARDIOVASCULAR: No chest pain or pressure. No palpitations. PULMONARY: No shortness of breath, cough or wheeze. GASTROINTESTINAL: No abdominal pain, nausea, vomiting or diarrhea, melena or bright red blood per rectum. GENITOURINARY: No urinary frequency, urgency, hesitancy or dysuria. MUSCULOSKELETAL: No joint or muscle pain, no back pain, no recent trauma. DERMATOLOGIC: No rash, no itching, no lesions. ENDOCRINE: No polyuria, polydipsia, no heat or cold intolerance. No recent change in weight. HEMATOLOGICAL: No anemia or easy bruising or bleeding. NEUROLOGIC: No headache, seizures, numbness, tingling or weakness. PSYCHIATRIC: No  depression, no loss of interest in normal activity or change in sleep pattern.     Exam:   BP 114/70   Pulse (!) 54   Ht 5' 5.75" (1.67 m)   Wt 157 lb (71.2 kg)   SpO2 96%   BMI 25.53 kg/m   Body mass index is 25.53 kg/m.  General appearance : Well developed well nourished female. No acute distress HEENT: Eyes: no retinal hemorrhage or exudates,  Neck supple, trachea midline, no carotid bruits, no thyroidmegaly Lungs: Clear to auscultation, no rhonchi or wheezes, or rib retractions  Heart: Regular rate and rhythm, no murmurs or gallops Breast:Examined in sitting and supine position were symmetrical in appearance, no palpable masses or tenderness,  no skin retraction, no nipple inversion, no nipple discharge, no skin discoloration, no axillary or supraclavicular lymphadenopathy Abdomen: no palpable masses or tenderness, no rebound or guarding Extremities: no edema or skin discoloration or tenderness  Pelvic: Vulva: Normal             Vagina: No gross lesions or discharge  Cervix: No gross lesions or discharge  Uterus  AV, normal size, shape and consistency, non-tender and mobile  Adnexa  Without masses or tenderness  Anus: Normal   Assessment/Plan:  72 y.o. female for annual exam   1. Well female exam with routine gynecological exam Postmenopause, well on no HRT.  No PMB.  No pelvic pain.  Rarely sexually active.  Pap Neg 07/2021.  Repeat Pap at 3 years. Urine/BMs normal. Breasts normal. Mammo Neg 03/2022.  BMI 25.53. Very fit, doing palates and tennis.  BD at [facility] 02/2021 Osteopenia T-Score -2.4. Repeat BD 02/2023. Vit D, Ca++.  Healthy nutrition. Hypothyroidism well controled now, followed by Dr Talmage Nap.  Health labs with Dr Talmage Nap.  Colonoscopy 2014, repeat this year.  2. Postmenopause Postmenopause, well on no HRT.  No PMB.  No pelvic pain.  Rarely sexually active.   3. Osteopenia, unspecified location  BD at [facility] 02/2021 Osteopenia T-Score -2.4. Repeat BD 02/2023. Vit D, Ca++.   Healthy nutrition.  Genia Del MD, 1:29 PM

## 2023-01-02 ENCOUNTER — Ambulatory Visit: Payer: Medicare HMO | Admitting: Obstetrics & Gynecology

## 2023-01-11 IMAGING — MR MR SHOULDER*L* W/O CM
5 series · 40 of 40 positions shown · non-contrast
Comparison: None.

CLINICAL DATA: Left shoulder pain, decreased range of motion

EXAM:
MRI OF THE LEFT SHOULDER WITHOUT CONTRAST
TECHNIQUE: Multiplanar, multisequence MR imaging of the shoulder was performed.
No intravenous contrast was administered.

[Series 3: T2 fat-sat · axial · left · 3.0mm · 0.59mm/px · z∈[-91,+19]mm · 8 of 30 slices shown (1 of 3)]
[im 1/30]
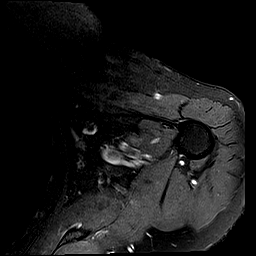
[im 5/30]
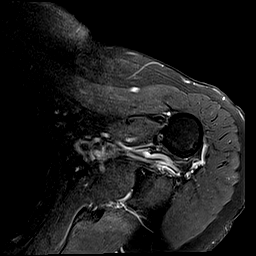
[im 9/30]
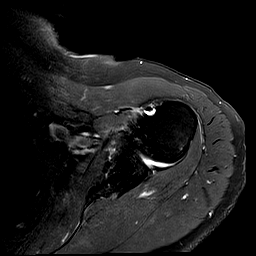
[im 13/30]
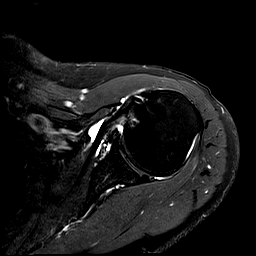
[im 17/30]
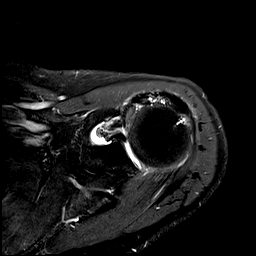
[im 21/30]
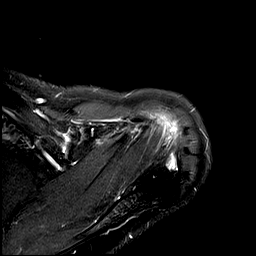
[im 25/30]
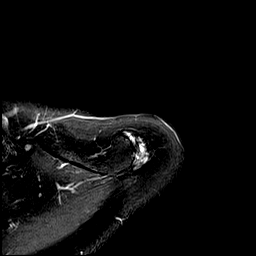
[im 30/30]
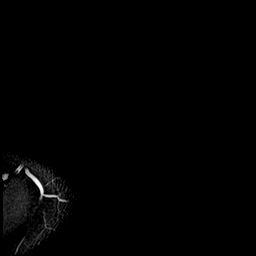

[Series 5: PD · oblique · left · 3.0mm · 0.59mm/px · 8 of 28 slices shown]
[im 1/28]
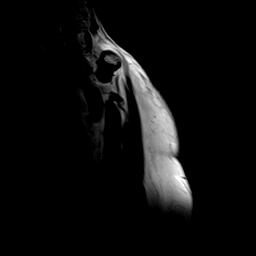
[im 4/28]
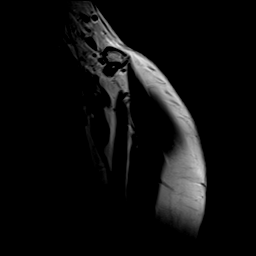
[im 8/28]
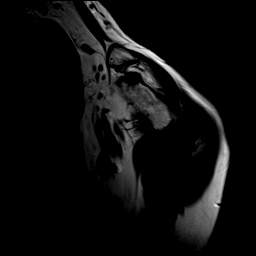
[im 12/28]
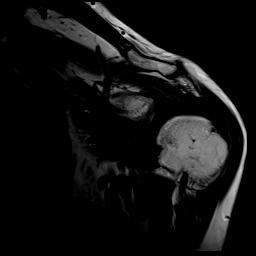
[im 16/28]
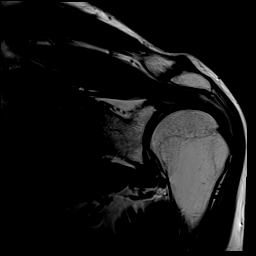
[im 20/28]
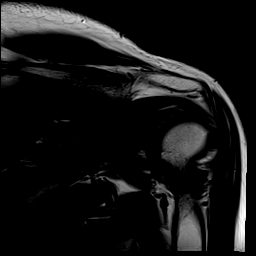
[im 24/28]
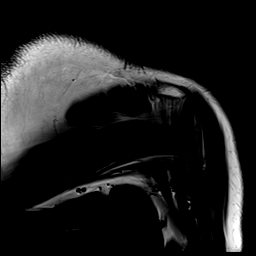
[im 28/28]
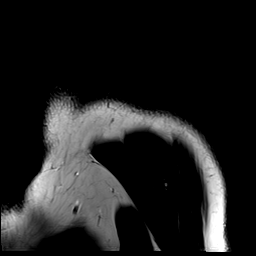

[Series 6: T2 fat-sat · oblique · left · 3.0mm · 0.59mm/px · 8 of 28 slices shown (2 of 3)]
[im 1/28]
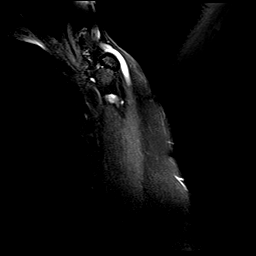
[im 4/28]
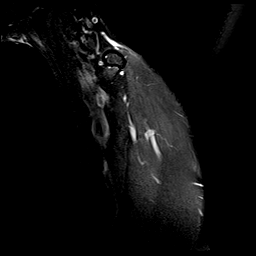
[im 8/28]
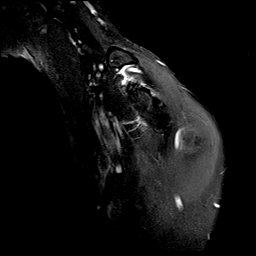
[im 12/28]
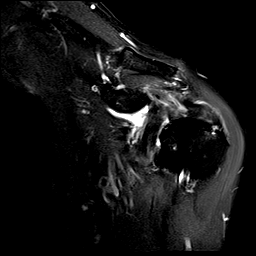
[im 16/28]
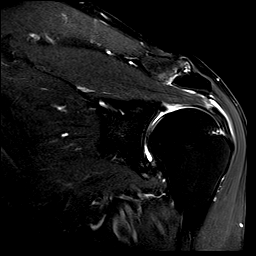
[im 20/28]
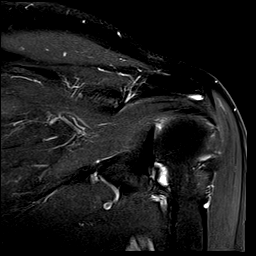
[im 24/28]
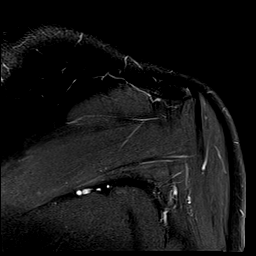
[im 28/28]
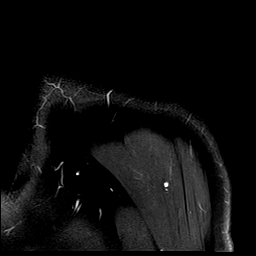

[Series 7: T2 fat-sat · oblique · left · 3.0mm · 0.62mm/px · 8 of 31 slices shown (3 of 3)]
[im 1/31]
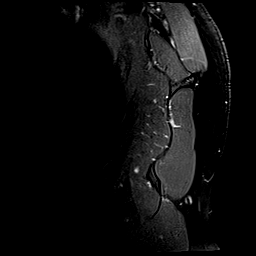
[im 5/31]
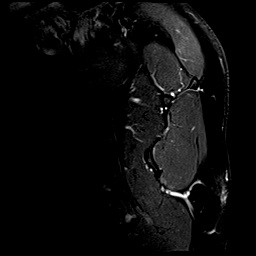
[im 9/31]
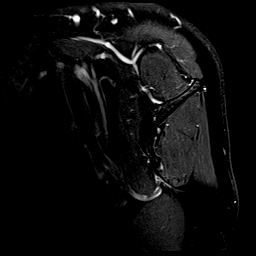
[im 13/31]
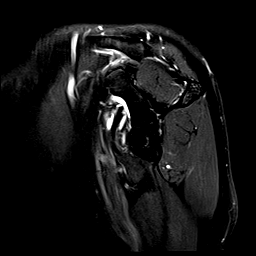
[im 18/31]
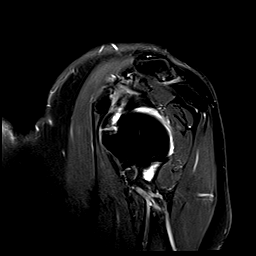
[im 22/31]
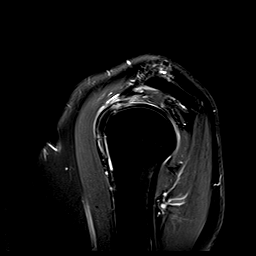
[im 26/31]
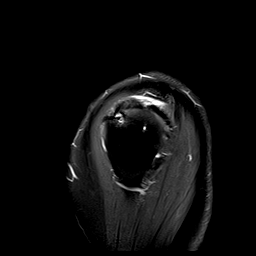
[im 31/31]
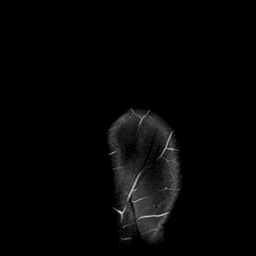

[Series 8: T1 · oblique · left · 3.0mm · 0.62mm/px · 8 of 31 slices shown]
[im 1/31]
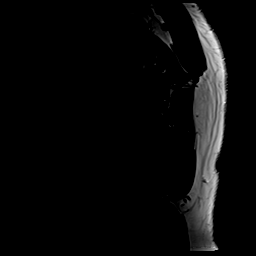
[im 5/31]
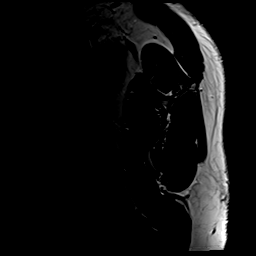
[im 9/31]
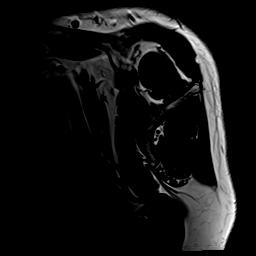
[im 13/31]
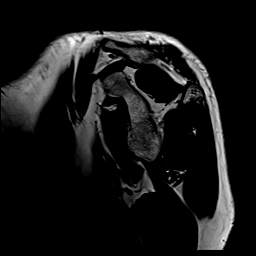
[im 18/31]
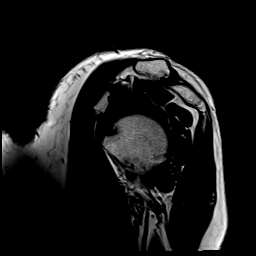
[im 22/31]
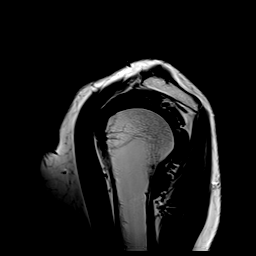
[im 26/31]
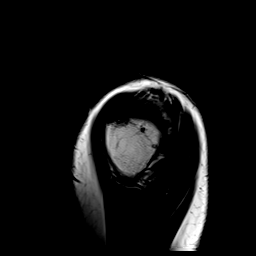
[im 31/31]
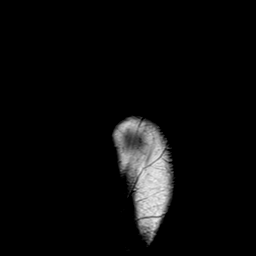

[40 of 40 positions shown; findings below may reference images not displayed]

FINDINGS: Rotator cuff: Moderate tendinosis of the supraspinatus tendon with a
small interstitial tear posteriorly 2 cm from the peripheral
insertion. Mild tendinosis of the infraspinatus tendon. Teres minor
tendon is intact. Mild tendinosis of the subscapularis tendon.

Muscles: No muscle atrophy or edema. No intramuscular fluid
collection or hematoma.

Biceps Long Head: Severe tendinosis of the intra-articular portion
of the long head of the biceps tendon.

Acromioclavicular Joint: Moderate arthropathy of the
acromioclavicular joint. Trace subacromial/subdeltoid bursal fluid.

Glenohumeral Joint: Small joint effusion. Partial-thickness
cartilage loss of the glenohumeral joint.

Labrum: Grossly intact, but evaluation is limited by lack of
intraarticular fluid/contrast.

Bones: No fracture or dislocation. No aggressive osseous lesion.
Mild subcortical reactive marrow changes in the lesser tuberosity.

Other: No fluid collection or hematoma.
IMPRESSION: 1. Moderate tendinosis of the supraspinatus tendon with a small
interstitial tear posteriorly 2 cm from the peripheral insertion.
2. Mild tendinosis of the infraspinatus tendon.
3. Mild tendinosis of the subscapularis tendon.
4. Severe tendinosis of the intra-articular portion of the long head
of the biceps tendon.
5. Partial-thickness cartilage loss of the glenohumeral joint.

## 2023-03-19 DIAGNOSIS — H16143 Punctate keratitis, bilateral: Secondary | ICD-10-CM | POA: Diagnosis not present

## 2023-03-20 DIAGNOSIS — H16143 Punctate keratitis, bilateral: Secondary | ICD-10-CM | POA: Diagnosis not present

## 2023-03-24 DIAGNOSIS — H16143 Punctate keratitis, bilateral: Secondary | ICD-10-CM | POA: Diagnosis not present

## 2023-03-30 DIAGNOSIS — M25562 Pain in left knee: Secondary | ICD-10-CM | POA: Diagnosis not present

## 2023-04-01 DIAGNOSIS — Z0100 Encounter for examination of eyes and vision without abnormal findings: Secondary | ICD-10-CM | POA: Diagnosis not present

## 2023-04-01 DIAGNOSIS — H16143 Punctate keratitis, bilateral: Secondary | ICD-10-CM | POA: Diagnosis not present

## 2023-04-03 DIAGNOSIS — R944 Abnormal results of kidney function studies: Secondary | ICD-10-CM | POA: Diagnosis not present

## 2023-04-03 DIAGNOSIS — E782 Mixed hyperlipidemia: Secondary | ICD-10-CM | POA: Diagnosis not present

## 2023-04-09 DIAGNOSIS — M25562 Pain in left knee: Secondary | ICD-10-CM | POA: Diagnosis not present

## 2023-04-10 DIAGNOSIS — R7303 Prediabetes: Secondary | ICD-10-CM | POA: Diagnosis not present

## 2023-04-10 DIAGNOSIS — Z6825 Body mass index (BMI) 25.0-25.9, adult: Secondary | ICD-10-CM | POA: Diagnosis not present

## 2023-04-10 DIAGNOSIS — E039 Hypothyroidism, unspecified: Secondary | ICD-10-CM | POA: Diagnosis not present

## 2023-04-14 DIAGNOSIS — Z713 Dietary counseling and surveillance: Secondary | ICD-10-CM | POA: Diagnosis not present

## 2023-04-14 DIAGNOSIS — E559 Vitamin D deficiency, unspecified: Secondary | ICD-10-CM | POA: Diagnosis not present

## 2023-04-14 DIAGNOSIS — R7303 Prediabetes: Secondary | ICD-10-CM | POA: Diagnosis not present

## 2023-04-14 DIAGNOSIS — E039 Hypothyroidism, unspecified: Secondary | ICD-10-CM | POA: Diagnosis not present

## 2023-04-17 DIAGNOSIS — Z1231 Encounter for screening mammogram for malignant neoplasm of breast: Secondary | ICD-10-CM | POA: Diagnosis not present

## 2023-04-17 DIAGNOSIS — M8588 Other specified disorders of bone density and structure, other site: Secondary | ICD-10-CM | POA: Diagnosis not present

## 2023-04-20 DIAGNOSIS — M25562 Pain in left knee: Secondary | ICD-10-CM | POA: Diagnosis not present

## 2023-04-21 ENCOUNTER — Encounter: Payer: Self-pay | Admitting: Obstetrics and Gynecology

## 2023-04-22 DIAGNOSIS — M23332 Other meniscus derangements, other medial meniscus, left knee: Secondary | ICD-10-CM | POA: Diagnosis not present

## 2023-04-27 DIAGNOSIS — M23332 Other meniscus derangements, other medial meniscus, left knee: Secondary | ICD-10-CM | POA: Diagnosis not present

## 2023-05-04 DIAGNOSIS — M23332 Other meniscus derangements, other medial meniscus, left knee: Secondary | ICD-10-CM | POA: Diagnosis not present

## 2023-05-13 DIAGNOSIS — E039 Hypothyroidism, unspecified: Secondary | ICD-10-CM | POA: Diagnosis not present

## 2023-05-13 DIAGNOSIS — R7303 Prediabetes: Secondary | ICD-10-CM | POA: Diagnosis not present

## 2023-05-13 DIAGNOSIS — Z713 Dietary counseling and surveillance: Secondary | ICD-10-CM | POA: Diagnosis not present

## 2023-05-28 DIAGNOSIS — M2242 Chondromalacia patellae, left knee: Secondary | ICD-10-CM | POA: Diagnosis not present

## 2023-05-28 DIAGNOSIS — M1712 Unilateral primary osteoarthritis, left knee: Secondary | ICD-10-CM | POA: Diagnosis not present

## 2023-05-28 DIAGNOSIS — S83232A Complex tear of medial meniscus, current injury, left knee, initial encounter: Secondary | ICD-10-CM | POA: Diagnosis not present

## 2023-05-28 DIAGNOSIS — G8918 Other acute postprocedural pain: Secondary | ICD-10-CM | POA: Diagnosis not present

## 2023-05-28 DIAGNOSIS — M23322 Other meniscus derangements, posterior horn of medial meniscus, left knee: Secondary | ICD-10-CM | POA: Diagnosis not present

## 2023-06-03 DIAGNOSIS — M25662 Stiffness of left knee, not elsewhere classified: Secondary | ICD-10-CM | POA: Diagnosis not present

## 2023-06-03 DIAGNOSIS — M6281 Muscle weakness (generalized): Secondary | ICD-10-CM | POA: Diagnosis not present

## 2023-06-15 DIAGNOSIS — M25662 Stiffness of left knee, not elsewhere classified: Secondary | ICD-10-CM | POA: Diagnosis not present

## 2023-06-15 DIAGNOSIS — M6281 Muscle weakness (generalized): Secondary | ICD-10-CM | POA: Diagnosis not present

## 2023-06-16 DIAGNOSIS — M6281 Muscle weakness (generalized): Secondary | ICD-10-CM | POA: Diagnosis not present

## 2023-06-16 DIAGNOSIS — M25662 Stiffness of left knee, not elsewhere classified: Secondary | ICD-10-CM | POA: Diagnosis not present

## 2023-06-23 DIAGNOSIS — M25662 Stiffness of left knee, not elsewhere classified: Secondary | ICD-10-CM | POA: Diagnosis not present

## 2023-06-23 DIAGNOSIS — M6281 Muscle weakness (generalized): Secondary | ICD-10-CM | POA: Diagnosis not present

## 2023-06-25 DIAGNOSIS — M25662 Stiffness of left knee, not elsewhere classified: Secondary | ICD-10-CM | POA: Diagnosis not present

## 2023-06-25 DIAGNOSIS — M6281 Muscle weakness (generalized): Secondary | ICD-10-CM | POA: Diagnosis not present

## 2023-06-30 DIAGNOSIS — M25662 Stiffness of left knee, not elsewhere classified: Secondary | ICD-10-CM | POA: Diagnosis not present

## 2023-06-30 DIAGNOSIS — M6281 Muscle weakness (generalized): Secondary | ICD-10-CM | POA: Diagnosis not present

## 2023-07-02 DIAGNOSIS — M6281 Muscle weakness (generalized): Secondary | ICD-10-CM | POA: Diagnosis not present

## 2023-07-02 DIAGNOSIS — M25662 Stiffness of left knee, not elsewhere classified: Secondary | ICD-10-CM | POA: Diagnosis not present

## 2023-07-07 DIAGNOSIS — M25662 Stiffness of left knee, not elsewhere classified: Secondary | ICD-10-CM | POA: Diagnosis not present

## 2023-07-07 DIAGNOSIS — M6281 Muscle weakness (generalized): Secondary | ICD-10-CM | POA: Diagnosis not present

## 2023-07-10 DIAGNOSIS — M25662 Stiffness of left knee, not elsewhere classified: Secondary | ICD-10-CM | POA: Diagnosis not present

## 2023-07-13 DIAGNOSIS — Z713 Dietary counseling and surveillance: Secondary | ICD-10-CM | POA: Diagnosis not present

## 2023-07-13 DIAGNOSIS — E559 Vitamin D deficiency, unspecified: Secondary | ICD-10-CM | POA: Diagnosis not present

## 2023-07-13 DIAGNOSIS — R7303 Prediabetes: Secondary | ICD-10-CM | POA: Diagnosis not present

## 2023-07-27 DIAGNOSIS — M25662 Stiffness of left knee, not elsewhere classified: Secondary | ICD-10-CM | POA: Diagnosis not present

## 2023-07-27 DIAGNOSIS — M6281 Muscle weakness (generalized): Secondary | ICD-10-CM | POA: Diagnosis not present

## 2023-08-10 DIAGNOSIS — M25662 Stiffness of left knee, not elsewhere classified: Secondary | ICD-10-CM | POA: Diagnosis not present

## 2023-08-10 DIAGNOSIS — M25661 Stiffness of right knee, not elsewhere classified: Secondary | ICD-10-CM | POA: Diagnosis not present

## 2023-08-10 DIAGNOSIS — R262 Difficulty in walking, not elsewhere classified: Secondary | ICD-10-CM | POA: Diagnosis not present

## 2023-08-13 DIAGNOSIS — R7303 Prediabetes: Secondary | ICD-10-CM | POA: Diagnosis not present

## 2023-08-13 DIAGNOSIS — Z713 Dietary counseling and surveillance: Secondary | ICD-10-CM | POA: Diagnosis not present

## 2023-08-17 DIAGNOSIS — M25562 Pain in left knee: Secondary | ICD-10-CM | POA: Diagnosis not present

## 2023-09-03 DIAGNOSIS — Z96652 Presence of left artificial knee joint: Secondary | ICD-10-CM | POA: Diagnosis not present

## 2023-09-03 DIAGNOSIS — M1712 Unilateral primary osteoarthritis, left knee: Secondary | ICD-10-CM | POA: Diagnosis not present

## 2023-09-03 DIAGNOSIS — G8918 Other acute postprocedural pain: Secondary | ICD-10-CM | POA: Diagnosis not present

## 2023-09-07 DIAGNOSIS — M6281 Muscle weakness (generalized): Secondary | ICD-10-CM | POA: Diagnosis not present

## 2023-09-07 DIAGNOSIS — M25662 Stiffness of left knee, not elsewhere classified: Secondary | ICD-10-CM | POA: Diagnosis not present

## 2023-09-07 DIAGNOSIS — Z96652 Presence of left artificial knee joint: Secondary | ICD-10-CM | POA: Diagnosis not present

## 2023-09-09 DIAGNOSIS — M25662 Stiffness of left knee, not elsewhere classified: Secondary | ICD-10-CM | POA: Diagnosis not present

## 2023-09-09 DIAGNOSIS — Z96652 Presence of left artificial knee joint: Secondary | ICD-10-CM | POA: Diagnosis not present

## 2023-09-09 DIAGNOSIS — M6281 Muscle weakness (generalized): Secondary | ICD-10-CM | POA: Diagnosis not present

## 2023-09-11 DIAGNOSIS — Z96652 Presence of left artificial knee joint: Secondary | ICD-10-CM | POA: Diagnosis not present

## 2023-09-11 DIAGNOSIS — M6281 Muscle weakness (generalized): Secondary | ICD-10-CM | POA: Diagnosis not present

## 2023-09-11 DIAGNOSIS — M25662 Stiffness of left knee, not elsewhere classified: Secondary | ICD-10-CM | POA: Diagnosis not present

## 2023-09-14 DIAGNOSIS — M25562 Pain in left knee: Secondary | ICD-10-CM | POA: Diagnosis not present

## 2023-09-15 DIAGNOSIS — M6281 Muscle weakness (generalized): Secondary | ICD-10-CM | POA: Diagnosis not present

## 2023-09-15 DIAGNOSIS — Z96652 Presence of left artificial knee joint: Secondary | ICD-10-CM | POA: Diagnosis not present

## 2023-09-15 DIAGNOSIS — M25662 Stiffness of left knee, not elsewhere classified: Secondary | ICD-10-CM | POA: Diagnosis not present

## 2023-09-17 DIAGNOSIS — Z96652 Presence of left artificial knee joint: Secondary | ICD-10-CM | POA: Diagnosis not present

## 2023-09-17 DIAGNOSIS — M6281 Muscle weakness (generalized): Secondary | ICD-10-CM | POA: Diagnosis not present

## 2023-09-17 DIAGNOSIS — M25662 Stiffness of left knee, not elsewhere classified: Secondary | ICD-10-CM | POA: Diagnosis not present

## 2023-09-22 DIAGNOSIS — M6281 Muscle weakness (generalized): Secondary | ICD-10-CM | POA: Diagnosis not present

## 2023-09-22 DIAGNOSIS — Z96652 Presence of left artificial knee joint: Secondary | ICD-10-CM | POA: Diagnosis not present

## 2023-09-22 DIAGNOSIS — M25662 Stiffness of left knee, not elsewhere classified: Secondary | ICD-10-CM | POA: Diagnosis not present

## 2023-09-24 DIAGNOSIS — M25661 Stiffness of right knee, not elsewhere classified: Secondary | ICD-10-CM | POA: Diagnosis not present

## 2023-09-24 DIAGNOSIS — M6281 Muscle weakness (generalized): Secondary | ICD-10-CM | POA: Diagnosis not present

## 2023-09-24 DIAGNOSIS — R262 Difficulty in walking, not elsewhere classified: Secondary | ICD-10-CM | POA: Diagnosis not present

## 2023-09-29 DIAGNOSIS — M6281 Muscle weakness (generalized): Secondary | ICD-10-CM | POA: Diagnosis not present

## 2023-09-29 DIAGNOSIS — Z96652 Presence of left artificial knee joint: Secondary | ICD-10-CM | POA: Diagnosis not present

## 2023-09-29 DIAGNOSIS — M25662 Stiffness of left knee, not elsewhere classified: Secondary | ICD-10-CM | POA: Diagnosis not present

## 2023-10-01 DIAGNOSIS — Z96652 Presence of left artificial knee joint: Secondary | ICD-10-CM | POA: Diagnosis not present

## 2023-10-01 DIAGNOSIS — M6281 Muscle weakness (generalized): Secondary | ICD-10-CM | POA: Diagnosis not present

## 2023-10-01 DIAGNOSIS — M25662 Stiffness of left knee, not elsewhere classified: Secondary | ICD-10-CM | POA: Diagnosis not present

## 2023-10-06 DIAGNOSIS — M25662 Stiffness of left knee, not elsewhere classified: Secondary | ICD-10-CM | POA: Diagnosis not present

## 2023-10-06 DIAGNOSIS — Z96652 Presence of left artificial knee joint: Secondary | ICD-10-CM | POA: Diagnosis not present

## 2023-10-06 DIAGNOSIS — M6281 Muscle weakness (generalized): Secondary | ICD-10-CM | POA: Diagnosis not present

## 2023-10-09 DIAGNOSIS — M25662 Stiffness of left knee, not elsewhere classified: Secondary | ICD-10-CM | POA: Diagnosis not present

## 2023-10-09 DIAGNOSIS — M6281 Muscle weakness (generalized): Secondary | ICD-10-CM | POA: Diagnosis not present

## 2023-10-09 DIAGNOSIS — Z96652 Presence of left artificial knee joint: Secondary | ICD-10-CM | POA: Diagnosis not present

## 2023-10-13 DIAGNOSIS — Z96652 Presence of left artificial knee joint: Secondary | ICD-10-CM | POA: Diagnosis not present

## 2023-10-13 DIAGNOSIS — M25662 Stiffness of left knee, not elsewhere classified: Secondary | ICD-10-CM | POA: Diagnosis not present

## 2023-10-13 DIAGNOSIS — M6281 Muscle weakness (generalized): Secondary | ICD-10-CM | POA: Diagnosis not present

## 2023-10-15 DIAGNOSIS — M25662 Stiffness of left knee, not elsewhere classified: Secondary | ICD-10-CM | POA: Diagnosis not present

## 2023-10-15 DIAGNOSIS — Z96652 Presence of left artificial knee joint: Secondary | ICD-10-CM | POA: Diagnosis not present

## 2023-10-15 DIAGNOSIS — M6281 Muscle weakness (generalized): Secondary | ICD-10-CM | POA: Diagnosis not present

## 2023-10-20 DIAGNOSIS — M25662 Stiffness of left knee, not elsewhere classified: Secondary | ICD-10-CM | POA: Diagnosis not present

## 2023-10-20 DIAGNOSIS — Z96652 Presence of left artificial knee joint: Secondary | ICD-10-CM | POA: Diagnosis not present

## 2023-10-20 DIAGNOSIS — M6281 Muscle weakness (generalized): Secondary | ICD-10-CM | POA: Diagnosis not present

## 2023-10-22 DIAGNOSIS — M6281 Muscle weakness (generalized): Secondary | ICD-10-CM | POA: Diagnosis not present

## 2023-10-22 DIAGNOSIS — Z96652 Presence of left artificial knee joint: Secondary | ICD-10-CM | POA: Diagnosis not present

## 2023-10-22 DIAGNOSIS — M25662 Stiffness of left knee, not elsewhere classified: Secondary | ICD-10-CM | POA: Diagnosis not present

## 2023-10-27 DIAGNOSIS — M25662 Stiffness of left knee, not elsewhere classified: Secondary | ICD-10-CM | POA: Diagnosis not present

## 2023-10-27 DIAGNOSIS — Z96652 Presence of left artificial knee joint: Secondary | ICD-10-CM | POA: Diagnosis not present

## 2023-10-27 DIAGNOSIS — M6281 Muscle weakness (generalized): Secondary | ICD-10-CM | POA: Diagnosis not present

## 2023-10-29 DIAGNOSIS — Z96652 Presence of left artificial knee joint: Secondary | ICD-10-CM | POA: Diagnosis not present

## 2023-10-29 DIAGNOSIS — M25662 Stiffness of left knee, not elsewhere classified: Secondary | ICD-10-CM | POA: Diagnosis not present

## 2023-10-29 DIAGNOSIS — M6281 Muscle weakness (generalized): Secondary | ICD-10-CM | POA: Diagnosis not present

## 2023-11-02 DIAGNOSIS — Z96652 Presence of left artificial knee joint: Secondary | ICD-10-CM | POA: Diagnosis not present

## 2023-11-02 DIAGNOSIS — M6281 Muscle weakness (generalized): Secondary | ICD-10-CM | POA: Diagnosis not present

## 2023-11-02 DIAGNOSIS — M25662 Stiffness of left knee, not elsewhere classified: Secondary | ICD-10-CM | POA: Diagnosis not present

## 2023-11-10 DIAGNOSIS — M6281 Muscle weakness (generalized): Secondary | ICD-10-CM | POA: Diagnosis not present

## 2023-11-10 DIAGNOSIS — M25662 Stiffness of left knee, not elsewhere classified: Secondary | ICD-10-CM | POA: Diagnosis not present

## 2023-11-10 DIAGNOSIS — Z96652 Presence of left artificial knee joint: Secondary | ICD-10-CM | POA: Diagnosis not present

## 2023-11-13 DIAGNOSIS — Z96652 Presence of left artificial knee joint: Secondary | ICD-10-CM | POA: Diagnosis not present

## 2023-11-13 DIAGNOSIS — M6281 Muscle weakness (generalized): Secondary | ICD-10-CM | POA: Diagnosis not present

## 2023-11-13 DIAGNOSIS — M25662 Stiffness of left knee, not elsewhere classified: Secondary | ICD-10-CM | POA: Diagnosis not present

## 2023-11-17 DIAGNOSIS — M6281 Muscle weakness (generalized): Secondary | ICD-10-CM | POA: Diagnosis not present

## 2023-11-17 DIAGNOSIS — M25662 Stiffness of left knee, not elsewhere classified: Secondary | ICD-10-CM | POA: Diagnosis not present

## 2023-11-17 DIAGNOSIS — Z96652 Presence of left artificial knee joint: Secondary | ICD-10-CM | POA: Diagnosis not present

## 2023-11-19 DIAGNOSIS — E039 Hypothyroidism, unspecified: Secondary | ICD-10-CM | POA: Diagnosis not present

## 2023-11-19 DIAGNOSIS — M25662 Stiffness of left knee, not elsewhere classified: Secondary | ICD-10-CM | POA: Diagnosis not present

## 2023-11-19 DIAGNOSIS — Z96652 Presence of left artificial knee joint: Secondary | ICD-10-CM | POA: Diagnosis not present

## 2023-11-19 DIAGNOSIS — M6281 Muscle weakness (generalized): Secondary | ICD-10-CM | POA: Diagnosis not present

## 2023-11-26 DIAGNOSIS — M6281 Muscle weakness (generalized): Secondary | ICD-10-CM | POA: Diagnosis not present

## 2023-11-26 DIAGNOSIS — Z96652 Presence of left artificial knee joint: Secondary | ICD-10-CM | POA: Diagnosis not present

## 2023-11-26 DIAGNOSIS — M25662 Stiffness of left knee, not elsewhere classified: Secondary | ICD-10-CM | POA: Diagnosis not present

## 2023-11-27 DIAGNOSIS — M6281 Muscle weakness (generalized): Secondary | ICD-10-CM | POA: Diagnosis not present

## 2023-11-27 DIAGNOSIS — M25662 Stiffness of left knee, not elsewhere classified: Secondary | ICD-10-CM | POA: Diagnosis not present

## 2023-11-27 DIAGNOSIS — Z96652 Presence of left artificial knee joint: Secondary | ICD-10-CM | POA: Diagnosis not present

## 2023-12-01 DIAGNOSIS — R7303 Prediabetes: Secondary | ICD-10-CM | POA: Diagnosis not present

## 2023-12-01 DIAGNOSIS — M6281 Muscle weakness (generalized): Secondary | ICD-10-CM | POA: Diagnosis not present

## 2023-12-01 DIAGNOSIS — Z780 Asymptomatic menopausal state: Secondary | ICD-10-CM | POA: Diagnosis not present

## 2023-12-01 DIAGNOSIS — M25662 Stiffness of left knee, not elsewhere classified: Secondary | ICD-10-CM | POA: Diagnosis not present

## 2023-12-01 DIAGNOSIS — E039 Hypothyroidism, unspecified: Secondary | ICD-10-CM | POA: Diagnosis not present

## 2023-12-01 DIAGNOSIS — M8580 Other specified disorders of bone density and structure, unspecified site: Secondary | ICD-10-CM | POA: Diagnosis not present

## 2023-12-01 DIAGNOSIS — Z96652 Presence of left artificial knee joint: Secondary | ICD-10-CM | POA: Diagnosis not present

## 2023-12-04 DIAGNOSIS — M25662 Stiffness of left knee, not elsewhere classified: Secondary | ICD-10-CM | POA: Diagnosis not present

## 2023-12-09 DIAGNOSIS — M25662 Stiffness of left knee, not elsewhere classified: Secondary | ICD-10-CM | POA: Diagnosis not present

## 2023-12-09 DIAGNOSIS — M6281 Muscle weakness (generalized): Secondary | ICD-10-CM | POA: Diagnosis not present

## 2023-12-09 DIAGNOSIS — Z96652 Presence of left artificial knee joint: Secondary | ICD-10-CM | POA: Diagnosis not present

## 2023-12-15 DIAGNOSIS — M6281 Muscle weakness (generalized): Secondary | ICD-10-CM | POA: Diagnosis not present

## 2023-12-15 DIAGNOSIS — M25662 Stiffness of left knee, not elsewhere classified: Secondary | ICD-10-CM | POA: Diagnosis not present

## 2023-12-15 DIAGNOSIS — Z96652 Presence of left artificial knee joint: Secondary | ICD-10-CM | POA: Diagnosis not present

## 2023-12-17 DIAGNOSIS — M6281 Muscle weakness (generalized): Secondary | ICD-10-CM | POA: Diagnosis not present

## 2023-12-17 DIAGNOSIS — M25662 Stiffness of left knee, not elsewhere classified: Secondary | ICD-10-CM | POA: Diagnosis not present

## 2023-12-17 DIAGNOSIS — Z96652 Presence of left artificial knee joint: Secondary | ICD-10-CM | POA: Diagnosis not present

## 2023-12-21 DIAGNOSIS — L910 Hypertrophic scar: Secondary | ICD-10-CM | POA: Diagnosis not present

## 2023-12-21 DIAGNOSIS — E559 Vitamin D deficiency, unspecified: Secondary | ICD-10-CM | POA: Diagnosis not present

## 2023-12-21 DIAGNOSIS — F419 Anxiety disorder, unspecified: Secondary | ICD-10-CM | POA: Diagnosis not present

## 2023-12-21 DIAGNOSIS — R7303 Prediabetes: Secondary | ICD-10-CM | POA: Diagnosis not present

## 2023-12-21 DIAGNOSIS — G479 Sleep disorder, unspecified: Secondary | ICD-10-CM | POA: Diagnosis not present

## 2023-12-21 DIAGNOSIS — E039 Hypothyroidism, unspecified: Secondary | ICD-10-CM | POA: Diagnosis not present

## 2023-12-21 DIAGNOSIS — Z6825 Body mass index (BMI) 25.0-25.9, adult: Secondary | ICD-10-CM | POA: Diagnosis not present

## 2023-12-21 DIAGNOSIS — Z13220 Encounter for screening for lipoid disorders: Secondary | ICD-10-CM | POA: Diagnosis not present

## 2023-12-22 DIAGNOSIS — L905 Scar conditions and fibrosis of skin: Secondary | ICD-10-CM | POA: Diagnosis not present

## 2023-12-22 DIAGNOSIS — M6281 Muscle weakness (generalized): Secondary | ICD-10-CM | POA: Diagnosis not present

## 2023-12-22 DIAGNOSIS — M25662 Stiffness of left knee, not elsewhere classified: Secondary | ICD-10-CM | POA: Diagnosis not present

## 2023-12-22 DIAGNOSIS — Z96652 Presence of left artificial knee joint: Secondary | ICD-10-CM | POA: Diagnosis not present

## 2023-12-24 DIAGNOSIS — Z96652 Presence of left artificial knee joint: Secondary | ICD-10-CM | POA: Diagnosis not present

## 2023-12-24 DIAGNOSIS — M25662 Stiffness of left knee, not elsewhere classified: Secondary | ICD-10-CM | POA: Diagnosis not present

## 2023-12-24 DIAGNOSIS — M6281 Muscle weakness (generalized): Secondary | ICD-10-CM | POA: Diagnosis not present

## 2023-12-29 DIAGNOSIS — M25662 Stiffness of left knee, not elsewhere classified: Secondary | ICD-10-CM | POA: Diagnosis not present

## 2023-12-29 DIAGNOSIS — M6281 Muscle weakness (generalized): Secondary | ICD-10-CM | POA: Diagnosis not present

## 2023-12-29 DIAGNOSIS — Z96652 Presence of left artificial knee joint: Secondary | ICD-10-CM | POA: Diagnosis not present

## 2024-01-03 NOTE — Progress Notes (Deleted)
   Michaela Elliott 1950-12-08 [id number]   History:  73 y.o. G3P3003 presents for annual exam. Postmenopausal - no HRT, no bleeding. Normal pap history. H/O hypothyroidism, Vit D deficiency.   Gynecologic History No LMP recorded. Patient is postmenopausal.   Contraception/Family planning: post menopausal status Sexually active: ***  Health Maintenance Last Pap: 07/29/2021. Results were: Normal Last mammogram: 04/17/2023. Results were: Normal Last colonoscopy: ***. Results were:  Last Dexa: 927/2024. Results were: T-score -2.2 FRAX 20% / 5.2%      No data to display           Past medical history, past surgical history, family history and social history were all reviewed and documented in the EPIC chart.  ROS:  A ROS was performed and pertinent positives and negatives are included.  Exam:  There were no vitals filed for this visit. There is no height or weight on file to calculate BMI.  General appearance:  Normal Thyroid :  Symmetrical, normal in size, without palpable masses or nodularity. Respiratory  Auscultation:  Clear without wheezing or rhonchi Cardiovascular  Auscultation:  Regular rate, without rubs, murmurs or gallops  Edema/varicosities:  Not grossly evident Abdominal  Soft,nontender, without masses, guarding or rebound.  Liver/spleen:  No organomegaly noted  Hernia:  None appreciated  Skin  Inspection:  Grossly normal Breasts: Examined lying and sitting.   Right: Without masses, retractions, nipple discharge or axillary adenopathy.   Left: Without masses, retractions, nipple discharge or axillary adenopathy. Pelvic: External genitalia:  no lesions              Urethra:  normal appearing urethra with no masses, tenderness or lesions              Bartholins and Skenes: normal                 Vagina: normal appearing vagina with normal color and discharge, no lesions              Cervix: no lesions Bimanual Exam:  Uterus:  no masses or tenderness               Adnexa: no mass, fullness, tenderness              Rectovaginal: Deferred              Anus:  normal, no lesions  Arvell Birchwood, CMA present as chaperone.   Assessment/Plan:  73 y.o. [id number] for annual exam.    No follow-ups on file.   Andee Bamberger DNP, 4:17 PM 01/03/2024

## 2024-01-04 ENCOUNTER — Encounter: Admitting: Nurse Practitioner

## 2024-01-04 DIAGNOSIS — Z01419 Encounter for gynecological examination (general) (routine) without abnormal findings: Secondary | ICD-10-CM

## 2024-01-04 DIAGNOSIS — M8589 Other specified disorders of bone density and structure, multiple sites: Secondary | ICD-10-CM

## 2024-01-04 DIAGNOSIS — Z780 Asymptomatic menopausal state: Secondary | ICD-10-CM

## 2024-01-06 DIAGNOSIS — M6281 Muscle weakness (generalized): Secondary | ICD-10-CM | POA: Diagnosis not present

## 2024-01-06 DIAGNOSIS — M25662 Stiffness of left knee, not elsewhere classified: Secondary | ICD-10-CM | POA: Diagnosis not present

## 2024-01-06 DIAGNOSIS — Z96652 Presence of left artificial knee joint: Secondary | ICD-10-CM | POA: Diagnosis not present

## 2024-01-08 DIAGNOSIS — Z13220 Encounter for screening for lipoid disorders: Secondary | ICD-10-CM | POA: Diagnosis not present

## 2024-01-08 DIAGNOSIS — Z136 Encounter for screening for cardiovascular disorders: Secondary | ICD-10-CM | POA: Diagnosis not present

## 2024-01-08 DIAGNOSIS — E559 Vitamin D deficiency, unspecified: Secondary | ICD-10-CM | POA: Diagnosis not present

## 2024-01-08 DIAGNOSIS — R7303 Prediabetes: Secondary | ICD-10-CM | POA: Diagnosis not present

## 2024-01-12 DIAGNOSIS — M25662 Stiffness of left knee, not elsewhere classified: Secondary | ICD-10-CM | POA: Diagnosis not present

## 2024-01-12 DIAGNOSIS — M6281 Muscle weakness (generalized): Secondary | ICD-10-CM | POA: Diagnosis not present

## 2024-01-12 DIAGNOSIS — Z96652 Presence of left artificial knee joint: Secondary | ICD-10-CM | POA: Diagnosis not present

## 2024-01-14 DIAGNOSIS — M6281 Muscle weakness (generalized): Secondary | ICD-10-CM | POA: Diagnosis not present

## 2024-01-14 DIAGNOSIS — M25662 Stiffness of left knee, not elsewhere classified: Secondary | ICD-10-CM | POA: Diagnosis not present

## 2024-01-14 DIAGNOSIS — Z96652 Presence of left artificial knee joint: Secondary | ICD-10-CM | POA: Diagnosis not present

## 2024-01-21 DIAGNOSIS — M25662 Stiffness of left knee, not elsewhere classified: Secondary | ICD-10-CM | POA: Diagnosis not present

## 2024-01-21 DIAGNOSIS — M6281 Muscle weakness (generalized): Secondary | ICD-10-CM | POA: Diagnosis not present

## 2024-01-21 DIAGNOSIS — Z96652 Presence of left artificial knee joint: Secondary | ICD-10-CM | POA: Diagnosis not present

## 2024-01-25 DIAGNOSIS — M25562 Pain in left knee: Secondary | ICD-10-CM | POA: Diagnosis not present

## 2024-01-26 DIAGNOSIS — M25662 Stiffness of left knee, not elsewhere classified: Secondary | ICD-10-CM | POA: Diagnosis not present

## 2024-01-26 DIAGNOSIS — M6281 Muscle weakness (generalized): Secondary | ICD-10-CM | POA: Diagnosis not present

## 2024-01-26 DIAGNOSIS — Z96652 Presence of left artificial knee joint: Secondary | ICD-10-CM | POA: Diagnosis not present

## 2024-01-28 DIAGNOSIS — Z96652 Presence of left artificial knee joint: Secondary | ICD-10-CM | POA: Diagnosis not present

## 2024-01-28 DIAGNOSIS — M25662 Stiffness of left knee, not elsewhere classified: Secondary | ICD-10-CM | POA: Diagnosis not present

## 2024-01-28 DIAGNOSIS — M6281 Muscle weakness (generalized): Secondary | ICD-10-CM | POA: Diagnosis not present

## 2024-02-02 DIAGNOSIS — M25662 Stiffness of left knee, not elsewhere classified: Secondary | ICD-10-CM | POA: Diagnosis not present

## 2024-02-02 DIAGNOSIS — M6281 Muscle weakness (generalized): Secondary | ICD-10-CM | POA: Diagnosis not present

## 2024-02-02 DIAGNOSIS — Z96652 Presence of left artificial knee joint: Secondary | ICD-10-CM | POA: Diagnosis not present

## 2024-04-01 DIAGNOSIS — S52002A Unspecified fracture of upper end of left ulna, initial encounter for closed fracture: Secondary | ICD-10-CM | POA: Diagnosis not present

## 2024-04-29 DIAGNOSIS — S52502D Unspecified fracture of the lower end of left radius, subsequent encounter for closed fracture with routine healing: Secondary | ICD-10-CM | POA: Diagnosis not present

## 2024-05-04 DIAGNOSIS — Z1231 Encounter for screening mammogram for malignant neoplasm of breast: Secondary | ICD-10-CM | POA: Diagnosis not present

## 2024-05-04 LAB — HM MAMMOGRAPHY

## 2024-05-10 ENCOUNTER — Encounter: Payer: Self-pay | Admitting: Obstetrics and Gynecology

## 2024-05-10 ENCOUNTER — Ambulatory Visit: Payer: Self-pay | Admitting: Obstetrics and Gynecology

## 2024-05-18 DIAGNOSIS — Z96652 Presence of left artificial knee joint: Secondary | ICD-10-CM | POA: Diagnosis not present

## 2024-06-06 DIAGNOSIS — E559 Vitamin D deficiency, unspecified: Secondary | ICD-10-CM | POA: Diagnosis not present

## 2024-06-06 DIAGNOSIS — Z1331 Encounter for screening for depression: Secondary | ICD-10-CM | POA: Diagnosis not present

## 2024-06-06 DIAGNOSIS — F419 Anxiety disorder, unspecified: Secondary | ICD-10-CM | POA: Diagnosis not present

## 2024-06-06 DIAGNOSIS — Z13220 Encounter for screening for lipoid disorders: Secondary | ICD-10-CM | POA: Diagnosis not present

## 2024-06-06 DIAGNOSIS — R7303 Prediabetes: Secondary | ICD-10-CM | POA: Diagnosis not present

## 2024-06-06 DIAGNOSIS — Z0000 Encounter for general adult medical examination without abnormal findings: Secondary | ICD-10-CM | POA: Diagnosis not present

## 2025-01-04 ENCOUNTER — Encounter: Admitting: Nurse Practitioner
